# Patient Record
Sex: Male | Born: 1990 | Race: Black or African American | Hispanic: No | Marital: Single | Smoking: Current every day smoker
Health system: Southern US, Community
[De-identification: ages and names within clinical notes are randomized; demographics above are authoritative.]

## PROBLEM LIST (undated history)

## (undated) DIAGNOSIS — G43909 Migraine, unspecified, not intractable, without status migrainosus: Secondary | ICD-10-CM

## (undated) DIAGNOSIS — I82409 Acute embolism and thrombosis of unspecified deep veins of unspecified lower extremity: Secondary | ICD-10-CM

## (undated) DIAGNOSIS — I2699 Other pulmonary embolism without acute cor pulmonale: Secondary | ICD-10-CM

---
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
## 2009-01-25 ENCOUNTER — Emergency Department (HOSPITAL_COMMUNITY): Admission: EM | Admit: 2009-01-25 | Discharge: 2009-01-26 | Payer: Self-pay | Admitting: Emergency Medicine

## 2010-03-04 ENCOUNTER — Ambulatory Visit: Payer: Self-pay | Admitting: Cardiology

## 2010-03-04 ENCOUNTER — Inpatient Hospital Stay (HOSPITAL_COMMUNITY): Admission: EM | Admit: 2010-03-04 | Discharge: 2010-03-06 | Payer: Self-pay | Admitting: Emergency Medicine

## 2010-03-04 ENCOUNTER — Encounter: Payer: Self-pay | Admitting: Cardiology

## 2010-03-05 ENCOUNTER — Encounter: Payer: Self-pay | Admitting: Cardiovascular Disease

## 2010-03-20 DIAGNOSIS — I4891 Unspecified atrial fibrillation: Secondary | ICD-10-CM

## 2010-03-27 ENCOUNTER — Encounter (INDEPENDENT_AMBULATORY_CARE_PROVIDER_SITE_OTHER): Payer: Self-pay | Admitting: *Deleted

## 2011-01-29 NOTE — Letter (Signed)
Summary: Appointment - Missed  [facility], Main Office  [address]   [place], [place] [address]   Phone: [phone]  Fax: [phone]     March 27, 2010 MRN: [id number]   MELO STAUBER [address] [place], [place]  [address]   Dear Mr. SUIT,  Our records indicate you missed your appointment on           03/21/10             with Dr.       .        BRODIE                            It is very important that we reach you to reschedule this appointment. We look forward to participating in your health care needs. Please contact us at the number listed above at your earliest convenience to reschedule this appointment.     Sincerely,    Glass blower/designer

## 2011-03-23 LAB — CBC
HCT: 39.5 % (ref 39.0–52.0)
HCT: 42.7 % (ref 39.0–52.0)
Hemoglobin: 13.4 g/dL (ref 13.0–17.0)
MCHC: 33.8 g/dL (ref 30.0–36.0)
MCV: 89.6 fL (ref 78.0–100.0)
MCV: 89.6 fL (ref 78.0–100.0)
Platelets: 230 10*3/uL (ref 150–400)
RBC: 4.41 MIL/uL (ref 4.22–5.81)
RBC: 4.77 MIL/uL (ref 4.22–5.81)
RDW: 13.7 % (ref 11.5–15.5)
RDW: 14.1 % (ref 11.5–15.5)
WBC: 9.2 10*3/uL (ref 4.0–10.5)

## 2011-03-23 LAB — POCT CARDIAC MARKERS
CKMB, poc: 24.4 ng/mL (ref 1.0–8.0)
Myoglobin, poc: 164 ng/mL (ref 12–200)
Troponin i, poc: 6.38 ng/mL (ref 0.00–0.09)

## 2011-03-23 LAB — POCT I-STAT, CHEM 8
BUN: 9 mg/dL (ref 6–23)
Calcium, Ion: 1.07 mmol/L — ABNORMAL LOW (ref 1.12–1.32)
Chloride: 98 mEq/L (ref 96–112)
Creatinine, Ser: 1.1 mg/dL (ref 0.4–1.5)
HCT: 45 % (ref 39.0–52.0)
Hemoglobin: 15.3 g/dL (ref 13.0–17.0)
Sodium: 135 mEq/L (ref 135–145)
TCO2: 31 mmol/L (ref 0–100)

## 2011-03-23 LAB — BASIC METABOLIC PANEL
BUN: 7 mg/dL (ref 6–23)
GFR calc non Af Amer: >60 mL/min (ref >60)

## 2011-03-23 LAB — DIFFERENTIAL
Basophils Absolute: 0 10*3/uL (ref 0.0–0.1)
Eosinophils Absolute: 0 10*3/uL (ref 0.0–0.7)
Eosinophils Relative: 0 % (ref 0–5)
Monocytes Relative: 17 % — ABNORMAL HIGH (ref 3–12)
Neutro Abs: 5.9 10*3/uL (ref 1.7–7.7)

## 2011-03-23 LAB — CARDIAC PANEL(CRET KIN+CKTOT+MB+TROPI)
Relative Index: 6.5 — ABNORMAL HIGH (ref 0.0–2.5)
Total CK: 2142 U/L — ABNORMAL HIGH (ref 7–232)
Troponin I: 20.58 ng/mL (ref 0.00–0.06)
Troponin I: 20.75 ng/mL (ref 0.00–0.06)

## 2011-03-23 LAB — DRUGS OF ABUSE SCREEN W/O ALC, ROUTINE URINE
Amphetamine Screen, Ur: NEGATIVE
Benzodiazepines.: POSITIVE — AB
Marijuana Metabolite: NEGATIVE

## 2011-03-23 LAB — CK TOTAL AND CKMB (NOT AT ARMC): CK, MB: 51.6 ng/mL (ref 0.3–4.0)

## 2011-04-13 LAB — CBC
MCHC: 33.4 g/dL (ref 30.0–36.0)
MCV: 89.4 fL (ref 78.0–100.0)
RBC: 4.83 MIL/uL (ref 4.22–5.81)

## 2011-04-13 LAB — DIFFERENTIAL
Basophils Relative: 0 % (ref 0–1)
Eosinophils Absolute: 0 10*3/uL (ref 0.0–0.7)
Monocytes Relative: 4 % (ref 3–12)
Neutrophils Relative %: 90 % — ABNORMAL HIGH (ref 43–77)

## 2011-04-13 LAB — BASIC METABOLIC PANEL
BUN: 8 mg/dL (ref 6–23)
CO2: 24 mEq/L (ref 19–32)
Chloride: 103 mEq/L (ref 96–112)
Creatinine, Ser: 1 mg/dL (ref 0.4–1.5)
GFR calc Af Amer: >60 mL/min (ref >60)

## 2011-04-13 LAB — TROPONIN I: Troponin I: <0.01 ng/mL (ref 0.00–0.06)

## 2012-05-31 ENCOUNTER — Other Ambulatory Visit: Payer: Self-pay

## 2012-05-31 ENCOUNTER — Emergency Department (HOSPITAL_COMMUNITY)
Admission: EM | Admit: 2012-05-31 | Discharge: 2012-05-31 | Discharge status: Against Medical Advice | Payer: Self-pay | Attending: Emergency Medicine | Admitting: Emergency Medicine

## 2012-05-31 ENCOUNTER — Encounter (HOSPITAL_COMMUNITY): Payer: Self-pay | Admitting: *Deleted

## 2012-05-31 DIAGNOSIS — R002 Palpitations: Secondary | ICD-10-CM | POA: Insufficient documentation

## 2012-05-31 DIAGNOSIS — R000 Tachycardia, unspecified: Secondary | ICD-10-CM | POA: Insufficient documentation

## 2012-05-31 DIAGNOSIS — F1210 Cannabis abuse, uncomplicated: Secondary | ICD-10-CM | POA: Insufficient documentation

## 2012-05-31 DIAGNOSIS — F411 Generalized anxiety disorder: Secondary | ICD-10-CM | POA: Insufficient documentation

## 2012-05-31 NOTE — ED Notes (Signed)
Pt has come to the nurses station saying "I'm good, is there any way I can check out"  When encouraged to see the EDP, pt continues to say "i'm good, i don't need to see anyone"

## 2012-05-31 NOTE — ED Notes (Signed)
Pt in by ems. Reported to ems that he felt like his heart "was racing" and he was "freaking out." Sts he just smoked some synthetic marijuana. VSS. Denies chest pain, sob, SI/HI.

## 2012-05-31 NOTE — ED Notes (Signed)
Pt reports smoking 2 puffs synthetic marijuana approx 25-64min ago. Anxiety and feeling like his heart was racing started just after that. Sts he has smoked this one time previously with no similar reactions. Pt reports symptoms are resolving currently, palpitations "calming down."

## 2012-11-12 ENCOUNTER — Encounter (HOSPITAL_COMMUNITY): Payer: Self-pay | Admitting: Emergency Medicine

## 2012-11-12 ENCOUNTER — Emergency Department (HOSPITAL_COMMUNITY)
Admission: EM | Admit: 2012-11-12 | Discharge: 2012-11-12 | Disposition: A | Discharge status: Home / Self Care | Payer: Self-pay | Attending: Emergency Medicine | Admitting: Emergency Medicine

## 2012-11-12 ENCOUNTER — Emergency Department (HOSPITAL_COMMUNITY): Payer: Self-pay

## 2012-11-12 DIAGNOSIS — IMO0001 Reserved for inherently not codable concepts without codable children: Secondary | ICD-10-CM | POA: Insufficient documentation

## 2012-11-12 DIAGNOSIS — F17200 Nicotine dependence, unspecified, uncomplicated: Secondary | ICD-10-CM | POA: Insufficient documentation

## 2012-11-12 DIAGNOSIS — J189 Pneumonia, unspecified organism: Secondary | ICD-10-CM

## 2012-11-12 DIAGNOSIS — R509 Fever, unspecified: Secondary | ICD-10-CM | POA: Insufficient documentation

## 2012-11-12 DIAGNOSIS — J159 Unspecified bacterial pneumonia: Secondary | ICD-10-CM | POA: Insufficient documentation

## 2012-11-12 DIAGNOSIS — J029 Acute pharyngitis, unspecified: Secondary | ICD-10-CM | POA: Insufficient documentation

## 2012-11-12 MED ORDER — AZITHROMYCIN 250 MG PO TABS: 250.0000 mg | ORAL_TABLET | Freq: Every day | ORAL | Status: DC

## 2012-11-12 MED ORDER — CEFTRIAXONE SODIUM 1 G IJ SOLR
1.0000 g | Freq: Once | INTRAMUSCULAR | Status: AC
  Administered 2012-11-12: 1 g via INTRAMUSCULAR
  Filled 2012-11-12: qty 10

## 2012-11-12 MED ORDER — ACETAMINOPHEN 325 MG PO TABS
650.0000 mg | ORAL_TABLET | Freq: Once | ORAL | Status: AC
  Administered 2012-11-12: 650 mg via ORAL
  Filled 2012-11-12: qty 2

## 2012-11-12 MED ORDER — AZITHROMYCIN 250 MG PO TABS
500.0000 mg | ORAL_TABLET | Freq: Once | ORAL | Status: AC
  Administered 2012-11-12: 500 mg via ORAL
  Filled 2012-11-12: qty 2

## 2012-11-12 NOTE — ED Provider Notes (Signed)
Medical screening examination/treatment/procedure(s) were performed by non-physician practitioner and as supervising physician I was immediately available for consultation/collaboration.   Rolan Bucco, MD 11/12/12 [phone]

## 2012-11-12 NOTE — ED Notes (Signed)
Pt c/o chills for past 30-44mins.  St's he has had a cold for approx 1 week.

## 2012-11-12 NOTE — ED Notes (Signed)
Pt returned from radiology.

## 2012-11-12 NOTE — ED Provider Notes (Signed)
History     CSN: [id number]  Arrival date & time 11/12/12  1831   First MD Initiated Contact with Patient 11/12/12 1949      Chief Complaint  Patient presents with  . Chills    (Consider location/radiation/quality/duration/timing/severity/associated sxs/prior treatment) HPI Comments: Patient with productive cough for 2 days now with fever and chills. Has not taken any OTC medications PTA  The history is provided by the patient.    History reviewed. No pertinent past medical history.  History reviewed. No pertinent past surgical history.  History reviewed. No pertinent family history.  History  Substance Use Topics  . Smoking status: Current Every Day Smoker  . Smokeless tobacco: Not on file  . Alcohol Use: No      Review of Systems  Constitutional: Positive for fever and chills.  HENT: Positive for sore throat. Negative for congestion, rhinorrhea and trouble swallowing.   Respiratory: Positive for cough. Negative for shortness of breath.   Gastrointestinal: Negative for vomiting, abdominal pain and diarrhea.  Genitourinary: Negative for dysuria.  Musculoskeletal: Positive for myalgias.  Skin: Negative for rash.  Neurological: Negative for weakness and headaches.  Hematological: Negative for adenopathy.  All other systems reviewed and are negative.    Allergies  Review of patient's allergies indicates no known allergies.  Home Medications   Current Outpatient Rx  Name  Route  Sig  Dispense  Refill  . AZITHROMYCIN 250 MG PO TABS   Oral   Take 1 tablet (250 mg total) by mouth daily.   4 tablet   0     BP 144/69  Pulse 89  Temp 100.3 F (37.9 C) (Oral)  Resp 20  SpO2 98%  Physical Exam  Nursing note and vitals reviewed. Constitutional: He is oriented to person, place, and time. He appears well-developed and well-nourished.  HENT:  Head: Normocephalic and atraumatic.  Right Ear: External ear normal.  Left Ear: External ear normal.    Mouth/Throat: Oropharynx is clear and moist. No oropharyngeal exudate.  Eyes: Pupils are equal, round, and reactive to light.  Neck: Normal range of motion.  Cardiovascular: Normal rate and regular rhythm.   Pulmonary/Chest: Effort normal and breath sounds normal. He has no wheezes. He exhibits no tenderness.  Abdominal: Soft. Bowel sounds are normal.  Musculoskeletal: Normal range of motion.  Neurological: He is alert and oriented to person, place, and time.  Skin: Skin is warm and dry. No rash noted. No erythema.    ED Course  Procedures (including critical care time)  Labs Reviewed - No data to display Dg Chest 2 View  11/12/2012  *RADIOLOGY REPORT*  Clinical Data: Cough and fever.  CHEST - 2 VIEW  Comparison: 03/04/2010.  Findings: Right middle lobe consolidation.  Recommend follow-up until clearance.  No pneumothorax.  Heart size within normal limits.  IMPRESSION: Right middle lobe consolidation.   Original Report Authenticated By: Lacy Duverney, M.D.      1. Community acquired pneumonia       MDM  Temperature down given IM Rocephin and PO azithromycin for CAP         Arman Filter, NP 11/12/12 2154

## 2013-03-13 ENCOUNTER — Emergency Department (HOSPITAL_COMMUNITY)
Admission: EM | Admit: 2013-03-13 | Discharge: 2013-03-14 | Disposition: A | Discharge status: Home / Self Care | Payer: No Typology Code available for payment source | Attending: Emergency Medicine | Admitting: Emergency Medicine

## 2013-03-13 DIAGNOSIS — S59912A Unspecified injury of left forearm, initial encounter: Secondary | ICD-10-CM

## 2013-03-13 DIAGNOSIS — Y9389 Activity, other specified: Secondary | ICD-10-CM | POA: Insufficient documentation

## 2013-03-13 DIAGNOSIS — T148XXA Other injury of unspecified body region, initial encounter: Secondary | ICD-10-CM

## 2013-03-13 DIAGNOSIS — IMO0002 Reserved for concepts with insufficient information to code with codable children: Secondary | ICD-10-CM | POA: Insufficient documentation

## 2013-03-13 DIAGNOSIS — F17200 Nicotine dependence, unspecified, uncomplicated: Secondary | ICD-10-CM | POA: Insufficient documentation

## 2013-03-13 DIAGNOSIS — Y92410 Unspecified street and highway as the place of occurrence of the external cause: Secondary | ICD-10-CM | POA: Insufficient documentation

## 2013-03-14 ENCOUNTER — Emergency Department (HOSPITAL_COMMUNITY): Payer: No Typology Code available for payment source

## 2013-03-14 ENCOUNTER — Encounter (HOSPITAL_COMMUNITY): Payer: Self-pay | Admitting: *Deleted

## 2013-03-14 MED ORDER — IBUPROFEN 800 MG PO TABS
800.0000 mg | ORAL_TABLET | Freq: Once | ORAL | Status: AC
  Administered 2013-03-14: 800 mg via ORAL
  Filled 2013-03-14: qty 1

## 2013-03-14 MED ORDER — IBUPROFEN 800 MG PO TABS: 800.0000 mg | ORAL_TABLET | Freq: Three times a day (TID) | ORAL | Status: DC | PRN
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
## 2013-03-14 NOTE — ED Provider Notes (Signed)
Medical screening examination/treatment/procedure(s) were performed by non-physician practitioner and as supervising physician I was immediately available for consultation/collaboration.  Jones Skene, M.D.     Jones Skene, MD 03/14/13 [phone]

## 2014-01-25 ENCOUNTER — Encounter (HOSPITAL_COMMUNITY): Payer: Self-pay | Admitting: Emergency Medicine

## 2014-01-25 ENCOUNTER — Emergency Department (HOSPITAL_COMMUNITY)
Admission: EM | Admit: 2014-01-25 | Discharge: 2014-01-25 | Disposition: A | Discharge status: Home / Self Care | Payer: No Typology Code available for payment source | Attending: Emergency Medicine | Admitting: Emergency Medicine

## 2014-01-25 DIAGNOSIS — Y929 Unspecified place or not applicable: Secondary | ICD-10-CM | POA: Insufficient documentation

## 2014-01-25 DIAGNOSIS — Y9389 Activity, other specified: Secondary | ICD-10-CM | POA: Insufficient documentation

## 2014-01-25 DIAGNOSIS — F17200 Nicotine dependence, unspecified, uncomplicated: Secondary | ICD-10-CM | POA: Insufficient documentation

## 2014-01-25 DIAGNOSIS — X58XXXA Exposure to other specified factors, initial encounter: Secondary | ICD-10-CM | POA: Insufficient documentation

## 2014-01-25 DIAGNOSIS — Z7982 Long term (current) use of aspirin: Secondary | ICD-10-CM | POA: Insufficient documentation

## 2014-01-25 DIAGNOSIS — S0300XA Dislocation of jaw, unspecified side, initial encounter: Secondary | ICD-10-CM | POA: Insufficient documentation

## 2014-01-25 MED ORDER — MIDAZOLAM HCL 2 MG/2ML IJ SOLN
INTRAMUSCULAR | Status: AC
  Administered 2014-01-25: 2 mg
  Filled 2014-01-25: qty 4

## 2014-01-25 MED ORDER — MIDAZOLAM HCL 2 MG/2ML IJ SOLN
2.0000 mg | Freq: Once | INTRAMUSCULAR | Status: AC
  Administered 2014-01-25: 2 mg via INTRAVENOUS
  Filled 2014-01-25: qty 2

## 2014-01-25 MED ORDER — FLUMAZENIL 0.5 MG/5ML IV SOLN
0.2000 mg | Freq: Once | INTRAVENOUS | Status: DC
  Filled 2014-01-25: qty 5

## 2014-01-25 NOTE — ED Provider Notes (Addendum)
CSN: [id number]     Arrival date & time 01/25/14  1133 History   First MD Initiated Contact with Patient 01/25/14 1149     Chief Complaint  Patient presents with  . Jaw Pain   (Consider location/radiation/quality/duration/timing/severity/associated sxs/prior Treatment) HPI 23 yo male presents with bilateral jaw pain and jaw dislocation that occurred earlier today while patient was yawning. Patient denies any trauma or injury. Patient states that his pain is dull, constant, and rated at 5/10. Pain worse with attempt to close mouth which is rated at 10/10. Patient states this is the first time this has occurred. Denies any prior medical hx. Denies any medication allergies. Denies any current medications other than aspirin and ibuprofen that he takes for tooth aches. Denies drug use.  History reviewed. No pertinent past medical history. History reviewed. No pertinent past surgical history. History reviewed. No pertinent family history. History  Substance Use Topics  . Smoking status: Current Every Day Smoker -- 0.50 packs/day    Types: Cigarettes  . Smokeless tobacco: Not on file  . Alcohol Use: No    Review of Systems  All other systems reviewed and are negative.    Allergies  Review of patient's allergies indicates no known allergies.  Home Medications   Current Outpatient Rx  Name  Route  Sig  Dispense  Refill  . aspirin 325 MG EC tablet   Oral   Take 325-650 mg by mouth every 4 (four) hours as needed for pain.         Marland Kitchen ibuprofen (ADVIL,MOTRIN) 200 MG tablet   Oral   Take 400 mg by mouth every 6 (six) hours as needed for moderate pain.          BP 113/64  Pulse 60  Temp(Src) 97.5 F (36.4 C) (Axillary)  Resp 14  SpO2 98% Physical Exam  Nursing note and vitals reviewed. Constitutional: He is oriented to person, place, and time. He appears well-developed and well-nourished. No distress.  HENT:  Head: Normocephalic and atraumatic.  Nose: Nose normal.   Mouth/Throat: Uvula is midline, oropharynx is clear and moist and mucous membranes are normal.  Mouth noted to be suck in open position.   Eyes: Conjunctivae and EOM are normal.  Cardiovascular: Normal rate and regular rhythm.  Exam reveals no gallop and no friction rub.   No murmur heard. Pulmonary/Chest: Effort normal and breath sounds normal. No respiratory distress. He has no wheezes. He has no rales.  Musculoskeletal: Normal range of motion. He exhibits no edema.  Neurological: He is alert and oriented to person, place, and time.  Skin: Skin is warm and dry. He is not diaphoretic.  Psychiatric: He has a normal mood and affect. His behavior is normal.    ED Course  Reduction of Dislocated Jaw Date/Time: 01/25/2014 1:27 PM Performed by: Rudene Anda Authorized by: Rudene Anda Consent: Verbal consent obtained. Risks and benefits: risks, benefits and alternatives were discussed Consent given by: patient Patient understanding: patient states understanding of the procedure being performed Site marked: the operative site was marked Patient identity confirmed: arm band and verbally with patient Time out: Immediately prior to procedure a "time out" was called to verify the correct patient, procedure, equipment, support staff and site/side marked as required. Preparation: Patient was prepped and draped in the usual sterile fashion. Local anesthesia used: no Patient sedated: yes Sedation type: moderate (conscious) sedation Sedatives: midazolam Sedation start date/time: 01/25/2014 12:45 PM Sedation end date/time: 01/25/2014 1:08 PM Vitals: Vital signs  were monitored during sedation. Patient tolerance: Patient tolerated the procedure well with no immediate complications. Comments: Good sedation of patient achieved. Downward traction was applied equally to bilateral sides of the mandible along patient's posterior inferior molars. While maintaining downward traction, gentle  forward force was applied until mandible was felt to slide back in to its normal anatomical position. Patient achieved immediate relief of pain and normal jaw alignment was achieved. Patient able to move jaw freely without pain.  Will monitor patient for 1 hour prior to discharge.     (including critical care time) Labs Review Labs Reviewed - No data to display Imaging Review No results found.  EKG Interpretation   None       MDM   1. Dislocated jaw    Patient tolerated procedure well. Jaw alignment normal. Patient able to talk without pain. Denies pain in jaw. Patient ambulated in hall without assistance. Patient VS are normal. Appears stable for discharge. Plan to have patient follow up with oral surgery in 2 days for reevaluation. Recommend soft foods and avoid yawning or chewing gum.   Meds given in ED:  Medications  flumazenil (ROMAZICON) injection 0.2 mg (0.2 mg Intravenous Not Given 01/25/14 1330)  midazolam (VERSED) injection 2 mg (2 mg Intravenous Given 01/25/14 1303)  midazolam (VERSED) 2 MG/2ML injection (2 mg  Given 01/25/14 1307)    New Prescriptions   No medications on file       Rudene AndaJacob Gray Dresden Lozito, PA-C 01/26/14 1800  Rudene AndaJacob Gray Bracy Pepper, PA-C 02/05/14 1214

## 2014-01-25 NOTE — ED Notes (Signed)
Mandible relocated by PA-C with Dr. Estell HarpinZammit at bedside.  Pt tolerated well.

## 2014-01-25 NOTE — ED Notes (Signed)
Ambulated patient without any incident.

## 2014-01-25 NOTE — Discharge Instructions (Signed)
Call and make appointment with Oral Surgery in a 2 days. Avoid opening mouth wide, and avoid tough/hard foods. Recommend soft food diet to prevent recurrence of dislocation. Return to ED should dislocation reoccur or you develop significant pain in jaw prior to followup with Oral surgery.    Emergency Department Resource Guide 1) Find a Doctor and Pay Out of Pocket Although you won't have to find out who is covered by your insurance plan, it is a good idea to ask around and get recommendations. You will then need to call the office and see if the doctor you have chosen will accept you as a new patient and what types of options they offer for patients who are self-pay. Some doctors offer discounts or will set up payment plans for their patients who do not have insurance, but you will need to ask so you aren't surprised when you get to your appointment.  2) Contact Your Local Health Department Not all health departments have doctors that can see patients for sick visits, but many do, so it is worth a call to see if yours does. If you don't know where your local health department is, you can check in your phone book. The CDC also has a tool to help you locate your state's health department, and many state websites also have listings of all of their local health departments.  3) Find a Walk-in Clinic If your illness is not likely to be very severe or complicated, you may want to try a walk in clinic. These are popping up all over the country in pharmacies, drugstores, and shopping centers. They're usually staffed by nurse practitioners or physician assistants that have been trained to treat common illnesses and complaints. They're usually fairly quick and inexpensive. However, if you have serious medical issues or chronic medical problems, these are probably not your best option.  No Primary Care Doctor: - Call Health Connect at  [phone] - they can help you locate a primary care doctor that  accepts your  insurance, provides certain services, etc. - Physician Referral Service- [phone]  Chronic Pain Problems: Organization         Address  Phone   Notes  [facility]  [phone] Patients need to be referred by their primary care doctor.   Medication Assistance: Organization         Address  Phone   Notes  [facility] [address] [place], [place] [id number] [phone] --Must be a resident of [facility] -- Must have NO insurance coverage whatsoever (no Medicaid/ Medicare, etc.) -- The pt. MUST have a primary care doctor that directs their care regularly and follows them in the community   [facility]  [phone]   [employer]  [phone]    Agencies that provide inexpensive medical care: Organization         Address  Phone   Notes  [facility]  [phone]   [facility]    [phone]   [facility] [address] [place], [place] [address] [phone]   [facility] [address], [place] [phone]   [facility]    [phone]   [facility]    [phone]   [facility]  [address], [place] Phone:  [phone], Fax:  [phone] Hours of Operation:  9 am -  6 pm, M-F.  Also accepts Medicaid/Medicare and self-pay.  °[facility] ° [address], [place] Phone: [phone], Fax: [phone]. Hours of Operation:  8:30 am - 5:30 pm, M-F.  Also accepts Medicaid and self-pay.  °[facility] [place] [address], [place] Phone: ([phone]) [phone]   °[facility] [address], [place], [place] ([phone], Ext. 123 Mondays & Thursdays: 7-9 AM.  First 15 patients are seen on a first come, first serve basis. °  ° °Medicaid-accepting [place] Providers: ° °Organization          Address  Phone   Notes  °[facility] [address], [place] [phone]) [phone] Also accepts self-pay patients.  °[facility] [address], [place] ° ([phone]) [phone]   °[facility] [address], [place] (336) [phone]   °[facility] [address] [place] [address], [place] ([phone]) [phone]   °Veita Bland [address], [place]  ° ([phone]) [phone] Only accepts [place] Access Medicaid patients after they have their name applied to their card.  ° °Self-Pay (no insurance) in [place]: ° °Organization         Address  Phone   Notes  °Sickle Cell Patients, [facility] [address], [place] ([phone]) [phone]   °[facility] [address], [place] (336) [phone]   °[facility] ° [address], [place] ([phone]) [phone]   °[facility]/Dr. Osei-Bonsu ° [address] [place] [address], [place] or [address], [place] [phone]) [phone] Phone number for both [place] and [place] locations is the same.  °[facility] [address], [place] ([phone]) [phone]   °[facility] [address] [place] [address], [place] or [address] (336) [phone] °(336) [phone]   °[facility] [address], [place] (336) [phone], phone; ([phone], fax Sees patients 1st and 3rd Saturday of every month.  Must not qualify for public or private insurance (i.e. Medicaid, Medicare, [place] Health Choice, Veterans' Benefits) • Household income should be no more than 200% of the poverty level •The clinic cannot treat you if you are pregnant or think you are pregnant • Sexually transmitted diseases are not treated at the clinic.  ° ° °Dental Care: °Organization         Address  Phone  Notes  °[facility] [address],  [place] ([phone]) [phone] Accepts children up to age 21 who are enrolled in Medicaid or [place] Health Choice; pregnant women with a Medicaid card; and children who have applied for Medicaid or [place] Health Choice, but were declined, whose parents can pay a reduced fee at time of service.  °[facility] [place]  [address], [place] (336) 641-7733 Accepts children up to age 21 who are enrolled in Medicaid or [place] Health Choice; pregnant women with a Medicaid card; and children who have applied for Medicaid or [place] Health Choice, but were declined, whose parents can pay a reduced fee at time of service.  °[facility] ° [address],  (336) [phone] Patients are seen by appointment only. Walk-ins are not accepted. [facility] will see patients 18 years of age and   older. °Monday - Tuesday (8am-5pm) °Most Wednesdays (8:30-5pm) °$30 per visit, cash only  °[facility] ° [address], [place] (336) [phone] Patients are seen by appointment only. Walk-ins are not accepted. [facility] will see patients 18 years of age and older. °One Wednesday Evening (Monthly: Volunteer Based).  $30 per visit, cash only  °[facility]  ([phone]) [phone] for adults; Children under age 4, call Graduate Pediatric Dentistry at [phone]. Children aged 4-14, please call [phone] to request a pediatric application. ° Dental services are provided in all areas of dental care including fillings, crowns and bridges, complete and partial dentures, implants, gum treatment, root canals, and extractions. Preventive care is also provided. Treatment is provided to both adults and children. °Patients are selected via a lottery and there is often a waiting list. °  °[facility] [address], °[place] ° (336) 763-8833 [email or web address] °  °[facility] [address], [place], [place]  (336)723-1848, Ext. 123 Second and Fourth Thursday of each month, opens at 6:30 AM; Clinic ends at 9 AM.  Patients are seen on a first-come first-served basis, and a limited number are seen during each clinic.  ° °[facility] ° [address], [place], [place] (336) [phone]   Eligibility Requirements °You must have lived in [place], or [place] counties for at least the last three months. °  You cannot be eligible for state or federal sponsored healthcare insurance, including Veterans Administration, Medicaid, or Medicare. °  You generally cannot be eligible for healthcare insurance through your employer.  °  How to apply: °Eligibility screenings are held every Tuesday and Wednesday afternoon from 1:00 pm until 4:00 pm. You do not need an appointment for the interview!  °[facility] [address], [place], [place] [phone]   °[facility]  336-342-8273   °[facility]  336-703-3100   °[facility]  336-570-6415   ° °Behavioral Health Resources in the Community: °Intensive Outpatient Programs °Organization         Address  Phone  Notes  °[facility] [address], [place], [place] 336-878-6098   °[facility] [address], [place], [place] 336-832-9800   °[facility] [address], [place], [place] ° 336-882-2125   °[facility] [address],  °[place], [place] 1-800-853-5163 or 336-641-4981   °Substance Abuse Resources °Organization         Address  Phone  Notes  °[facility]  336-882-2125   °[facility]  336-784-9470   °[facility]  336-285-9073   °[facility]  336-845-3988   °Residential & Outpatient Substance Abuse Program  1-800-659-3381   °Psychological Services °Organization         Address  Phone  Notes  °[facility]  336- 832-9600   °[facility]  336- 378-7881    °[facility] [address], [place] 1-800-853-5163 or 336-641-4981   ° °Mobile Crisis Teams °Organization         Address  Phone  Notes  °[facility], Mobile Crisis Care Unit  1-877-626-1772   °Assertive °[facility] ° [address] [place], [place] 336-834-9664   °Sharon DeEsch [address] °[place] 336-554-5454   ° °Self-Help/Support Groups °Organization         Address    Phone             Notes  °[facility] [place] - variety of support groups  336- 373-1402 Call for more information  °Narcotics Anonymous (NA), [facility] [address], °[place]  2 meetings at this location  ° °Residential Treatment Programs °Organization         Address  Phone  Notes  °[facility] [address],    °[place]  1-866-801-8205   °[facility] ° [address], [place], [place] 704-293-8524   °[facility] [address], [place] 336-845-3988 Admissions: 8am-3pm M-F  °[facility] [address]    °[place], [place] 336-841-1104   °[facility] [address], [place], [place] 336-379-7146   °[facility] [address]  °[place], [place] 336-285-9073   °[facility] - Intensive Outpatient [address], [place], [place] 336-852-3033   °[facility] [address]  °[place], [place] 1-877-615-2722 or 336-784-9470   °[facility] ([facility] [address] [place], [place] 336-227-7417 Accepts Medicaid  °[facility] [address]  °[place] 1-800-659-3381 Substance Abuse/Addiction Treatment  ° °[place] Behavioral Health Resources °Organization         Address  Phone  Notes  °[facility]  (888) 581-9988   °Julie Brannon, PhD [address] [place], [place]   (336) 349-5553 or (336) 951-0000   °[facility]   [address] °[place], [place] (336) 349-4454   °[facility] [address], [place], [place] (336) 342-8316 Insurance/Medicaid/sponsorship through [facility]  °[facility] [address]                                    [place], [place] (336) 342-8316 Therapy/tele-psych/case  °[facility] [address]  ° [place], [place] (336) 349-2233    °Dr. Arfeen  (336) 349-4544   °[facility] [place]  [employer] [place] [facility] 1) [address],  °2) [address], [place] °3)  [address], [place] (336) 349-3220 °(336) 342-7768 ° °(336) 342-8140   °[place] [facility] (336) 342-1394 or (336) 342-3537 (After Hours)    ° ° ° °

## 2014-01-25 NOTE — ED Notes (Signed)
Pt rests with eyes closed but is easily awakened.  Pt is oriented x 4, able to give personal information to registration.

## 2014-01-25 NOTE — ED Notes (Signed)
Pt reports yawning approx 30 mins ago and now unable to close his mouth and having jaw pain. No hx of same.

## 2014-01-26 NOTE — ED Provider Notes (Signed)
Medical screening examination/treatment/procedure(s) were conducted as a shared visit with non-physician practitioner(s) and myself.  I personally evaluated the patient during the encounter.  EKG Interpretation   None       Pt with jaw locked open.  pe mouth locked open.  Pt cannot close mouth  Benny LennertJoseph L Patrisia Faeth, MD 01/26/14 2255

## 2014-02-05 NOTE — ED Provider Notes (Signed)
Medical screening examination/treatment/procedure(s) were performed by non-physician practitioner and as supervising physician I was immediately available for consultation/collaboration.  EKG Interpretation   None         Careli Luzader L Nguyen Todorov, MD 02/05/14 1534 

## 2014-04-05 ENCOUNTER — Encounter (HOSPITAL_COMMUNITY): Payer: Self-pay | Admitting: Emergency Medicine

## 2014-04-05 ENCOUNTER — Emergency Department (HOSPITAL_COMMUNITY)
Admission: EM | Admit: 2014-04-05 | Discharge: 2014-04-05 | Disposition: A | Discharge status: Home / Self Care | Payer: No Typology Code available for payment source | Attending: Emergency Medicine | Admitting: Emergency Medicine

## 2014-04-05 DIAGNOSIS — Y939 Activity, unspecified: Secondary | ICD-10-CM | POA: Insufficient documentation

## 2014-04-05 DIAGNOSIS — F17200 Nicotine dependence, unspecified, uncomplicated: Secondary | ICD-10-CM | POA: Insufficient documentation

## 2014-04-05 DIAGNOSIS — M542 Cervicalgia: Secondary | ICD-10-CM | POA: Insufficient documentation

## 2014-04-05 DIAGNOSIS — M25519 Pain in unspecified shoulder: Secondary | ICD-10-CM | POA: Insufficient documentation

## 2014-04-05 DIAGNOSIS — Y9289 Other specified places as the place of occurrence of the external cause: Secondary | ICD-10-CM | POA: Insufficient documentation

## 2014-04-05 MED ORDER — HYDROCODONE-ACETAMINOPHEN 5-325 MG PO TABS: 1.0000 | ORAL_TABLET | Freq: Four times a day (QID) | ORAL | Status: DC | PRN
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
## 2014-04-05 NOTE — ED Provider Notes (Signed)
Medical screening examination/treatment/procedure(s) were performed by non-physician practitioner and as supervising physician I was immediately available for consultation/collaboration.   EKG Interpretation None        Curlee Bogan, MD 04/05/14 2354 

## 2014-06-10 ENCOUNTER — Emergency Department (HOSPITAL_COMMUNITY)
Admission: EM | Admit: 2014-06-10 | Discharge: 2014-06-10 | Disposition: A | Discharge status: Home / Self Care | Payer: No Typology Code available for payment source | Attending: Emergency Medicine | Admitting: Emergency Medicine

## 2014-06-10 ENCOUNTER — Encounter (HOSPITAL_COMMUNITY): Payer: Self-pay | Admitting: Emergency Medicine

## 2014-06-10 DIAGNOSIS — S161XXA Strain of muscle, fascia and tendon at neck level, initial encounter: Secondary | ICD-10-CM

## 2014-06-10 DIAGNOSIS — F17200 Nicotine dependence, unspecified, uncomplicated: Secondary | ICD-10-CM | POA: Insufficient documentation

## 2014-06-10 DIAGNOSIS — Y92410 Unspecified street and highway as the place of occurrence of the external cause: Secondary | ICD-10-CM | POA: Insufficient documentation

## 2014-06-10 DIAGNOSIS — Y9389 Activity, other specified: Secondary | ICD-10-CM | POA: Insufficient documentation

## 2014-06-10 DIAGNOSIS — H6690 Otitis media, unspecified, unspecified ear: Secondary | ICD-10-CM | POA: Insufficient documentation

## 2014-06-10 DIAGNOSIS — S0990XA Unspecified injury of head, initial encounter: Secondary | ICD-10-CM | POA: Insufficient documentation

## 2014-06-10 DIAGNOSIS — H6691 Otitis media, unspecified, right ear: Secondary | ICD-10-CM

## 2014-06-10 DIAGNOSIS — J029 Acute pharyngitis, unspecified: Secondary | ICD-10-CM | POA: Insufficient documentation

## 2014-06-10 DIAGNOSIS — S139XXA Sprain of joints and ligaments of unspecified parts of neck, initial encounter: Secondary | ICD-10-CM | POA: Insufficient documentation

## 2014-06-10 MED ORDER — CYCLOBENZAPRINE HCL 10 MG PO TABS: 10.0000 mg | ORAL_TABLET | Freq: Two times a day (BID) | ORAL | Status: DC | PRN

## 2014-06-10 MED ORDER — AMOXICILLIN 500 MG PO CAPS: 500.0000 mg | ORAL_CAPSULE | Freq: Three times a day (TID) | ORAL | Status: DC

## 2014-06-10 MED ORDER — NAPROXEN 500 MG PO TABS: 500.0000 mg | ORAL_TABLET | Freq: Two times a day (BID) | ORAL | Status: DC

## 2014-06-10 NOTE — Discharge Instructions (Signed)
Cervical Sprain °A cervical sprain is an injury in the neck in which the strong, fibrous tissues (ligaments) that connect your neck bones stretch or tear. Cervical sprains can range from mild to severe. Severe cervical sprains can cause the neck vertebrae to be unstable. This can lead to damage of the spinal cord and can result in serious nervous system problems. The amount of time it takes for a cervical sprain to get better depends on the cause and extent of the injury. Most cervical sprains heal in 1 to 3 weeks. °CAUSES  °Severe cervical sprains may be caused by:  °· Contact sport injuries (such as from football, rugby, wrestling, hockey, auto racing, gymnastics, diving, martial arts, or boxing).   °· Motor vehicle collisions.   °· Whiplash injuries. This is an injury from a sudden forward-and backward whipping movement of the head and neck.  °· Falls.   °Mild cervical sprains may be caused by:  °· Being in an awkward position, such as while cradling a telephone between your ear and shoulder.   °· Sitting in a chair that does not offer proper support.   °· Working at a poorly designed computer station.   °· Looking up or down for long periods of time.   °SYMPTOMS  °· Pain, soreness, stiffness, or a burning sensation in the front, back, or sides of the neck. This discomfort may develop immediately after the injury or slowly, 24 hours or more after the injury.   °· Pain or tenderness directly in the middle of the back of the neck.   °· Shoulder or upper back pain.   °· Limited ability to move the neck.   °· Headache.   °· Dizziness.   °· Weakness, numbness, or tingling in the hands or arms.   °· Muscle spasms.   °· Difficulty swallowing or chewing.   °· Tenderness and swelling of the neck.   °DIAGNOSIS  °Most of the time your health care provider can diagnose a cervical sprain by taking your history and doing a physical exam. Your health care provider will ask about previous neck injuries and any known neck  problems, such as arthritis in the neck. X-rays may be taken to find out if there are any other problems, such as with the bones of the neck. Other tests, such as a CT scan or MRI, may also be needed.  °TREATMENT  °Treatment depends on the severity of the cervical sprain. Mild sprains can be treated with rest, keeping the neck in place (immobilization), and pain medicines. Severe cervical sprains are immediately immobilized. Further treatment is done to help with pain, muscle spasms, and other symptoms and may include: °· Medicines, such as pain relievers, numbing medicines, or muscle relaxants.   °· Physical therapy. This may involve stretching exercises, strengthening exercises, and posture training. Exercises and improved posture can help stabilize the neck, strengthen muscles, and help stop symptoms from returning.   °HOME CARE INSTRUCTIONS  °· Put ice on the injured area.   °· Put ice in a plastic bag.   °· Place a towel between your skin and the bag.   °· Leave the ice on for 15 20 minutes, 3 4 times a day.   °· If your injury was severe, you may have been given a cervical collar to wear. A cervical collar is a two-piece collar designed to keep your neck from moving while it heals. °· Do not remove the collar unless instructed by your health care provider. °· If you have long hair, keep it outside of the collar. °· Ask your health care provider before making any adjustments to your collar.   Minor adjustments may be required over time to improve comfort and reduce pressure on your chin or on the back of your head.  Ifyou are allowed to remove the collar for cleaning or bathing, follow your health care provider's instructions on how to do so safely.  Keep your collar clean by wiping it with mild soap and water and drying it completely. If the collar you have been given includes removable pads, remove them every 1 2 days and hand wash them with soap and water. Allow them to air dry. They should be completely  dry before you wear them in the collar.  If you are allowed to remove the collar for cleaning and bathing, wash and dry the skin of your neck. Check your skin for irritation or sores. If you see any, tell your health care provider.  Do not drive while wearing the collar.   Only take over-the-counter or prescription medicines for pain, discomfort, or fever as directed by your health care provider.   Keep all follow-up appointments as directed by your health care provider.   Keep all physical therapy appointments as directed by your health care provider.   Make any needed adjustments to your workstation to promote good posture.   Avoid positions and activities that make your symptoms worse.   Warm up and stretch before being active to help prevent problems.  SEEK MEDICAL CARE IF:   Your pain is not controlled with medicine.   You are unable to decrease your pain medicine over time as planned.   Your activity level is not improving as expected.  SEEK IMMEDIATE MEDICAL CARE IF:   You develop any bleeding.  You develop stomach upset.  You have signs of an allergic reaction to your medicine.   Your symptoms get worse.   You develop new, unexplained symptoms.   You have numbness, tingling, weakness, or paralysis in any part of your body.  MAKE SURE YOU:   Understand these instructions.  Will watch your condition.  Will get help right away if you are not doing well or get worse. Document Released: 10/11/2007 Document Revised: 10/04/2013 Document Reviewed: 06/21/2013 [facility] Patient Information 2014 [employer], [place]. Otitis Media, Adult Otitis media is redness, soreness, and swelling (inflammation) of the middle ear. Otitis media may be caused by allergies or, most commonly, by infection. Often it occurs as a complication of the common cold. SIGNS AND SYMPTOMS Symptoms of otitis media may include:  Earache.  Fever.  Ringing in your  ear.  Headache.  Leakage of fluid from the ear. DIAGNOSIS To diagnose otitis media, your health care provider will examine your ear with an otoscope. This is an instrument that allows your health care provider to see into your ear in order to examine your eardrum. Your health care provider also will ask you questions about your symptoms. TREATMENT  Typically, otitis media resolves on its own within 3 5 days. Your health care provider may prescribe medicine to ease your symptoms of pain. If otitis media does not resolve within 5 days or is recurrent, your health care provider may prescribe antibiotic medicines if he or she suspects that a bacterial infection is the cause. HOME CARE INSTRUCTIONS   Take your medicine as directed until it is gone, even if you feel better after the first few days.  Only take over-the-counter or prescription medicines for pain, discomfort, or fever as directed by your health care provider.  Follow up with your health care provider as directed. SEEK MEDICAL  CARE IF:  You have otitis media only in one ear or bleeding from your nose or both.  You notice a lump on your neck.  You are not getting better in 3 5 days.  You feel worse instead of better. SEEK IMMEDIATE MEDICAL CARE IF:   You have pain that is not controlled with medicine.  You have swelling, redness, or pain around your ear or stiffness in your neck.  You notice that part of your face is paralyzed.  You notice that the bone behind your ear (mastoid) is tender when you touch it. MAKE SURE YOU:   Understand these instructions.  Will watch your condition.  Will get help right away if you are not doing well or get worse. Document Released: 09/18/2004 Document Revised: 10/04/2013 Document Reviewed: 07/11/2013 [facility] Patient Information 2014 [employer], [place].
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
## 2014-06-11 NOTE — ED Provider Notes (Signed)
Medical screening examination/treatment/procedure(s) were performed by non-physician practitioner and as supervising physician I was immediately available for consultation/collaboration.   EKG Interpretation None        Amaliya Whitelaw F Jawana Reagor, MD 06/11/14 1447 

## 2014-07-11 ENCOUNTER — Emergency Department (HOSPITAL_COMMUNITY)
Admission: EM | Admit: 2014-07-11 | Discharge: 2014-07-11 | Disposition: A | Discharge status: Home / Self Care | Payer: No Typology Code available for payment source | Attending: Emergency Medicine | Admitting: Emergency Medicine

## 2014-07-11 ENCOUNTER — Encounter (HOSPITAL_COMMUNITY): Payer: Self-pay | Admitting: Emergency Medicine

## 2014-07-11 DIAGNOSIS — K0889 Other specified disorders of teeth and supporting structures: Secondary | ICD-10-CM

## 2014-07-11 DIAGNOSIS — K089 Disorder of teeth and supporting structures, unspecified: Secondary | ICD-10-CM | POA: Insufficient documentation

## 2014-07-11 DIAGNOSIS — F17200 Nicotine dependence, unspecified, uncomplicated: Secondary | ICD-10-CM | POA: Insufficient documentation

## 2014-07-11 MED ORDER — NAPROXEN 500 MG PO TABS
500.0000 mg | ORAL_TABLET | Freq: Once | ORAL | Status: AC
  Administered 2014-07-11: 500 mg via ORAL
  Filled 2014-07-11: qty 1

## 2014-07-11 MED ORDER — PENICILLIN V POTASSIUM 500 MG PO TABS: 500.0000 mg | ORAL_TABLET | Freq: Four times a day (QID) | ORAL | Status: DC

## 2014-07-11 MED ORDER — MELOXICAM 15 MG PO TABS: 15.0000 mg | ORAL_TABLET | Freq: Every day | ORAL | Status: DC

## 2014-07-11 NOTE — ED Notes (Signed)
Pt states that he has had dental pain / swollen gums x 1 weeks; pt also c/o headache that radiates down his neck to his shoulders; pt states that it just aches and throbs and he is unable to sleep

## 2014-07-11 NOTE — ED Provider Notes (Signed)
CSN: [id number]     Arrival date & time 07/11/14  4098 History   First MD Initiated Contact with Patient 07/11/14 [phone]     Chief Complaint  Patient presents with  . Dental Pain  . Headache     (Consider location/radiation/quality/duration/timing/severity/associated sxs/prior Treatment) HPI Comments: The patient is a 23 year old otherwise healthy male who presents with dental pain that started gradually one week ago. The dental pain is severe, constant and progressively worsening. The pain is aching and located in left upper jaw. The pain radiates down to his left neck and shoulder. Eating makes the pain worse. Nothing makes the pain better. The patient has not tried anything for pain. No associated symptoms. Patient denies headache, neck pain/stiffness, fever, NVD, edema, sore throat, throat swelling, wheezing, SOB, chest pain, abdominal pain.     Patient is a 23 y.o. male presenting with tooth pain and headaches.  Dental Pain Associated symptoms: headaches   Associated symptoms: no fever and no neck pain   Headache Associated symptoms: no abdominal pain, no diarrhea, no dizziness, no fatigue, no fever, no nausea, no neck pain and no vomiting     History reviewed. No pertinent past medical history. History reviewed. No pertinent past surgical history. No family history on file. History  Substance Use Topics  . Smoking status: Current Every Day Smoker -- 0.50 packs/day    Types: Cigarettes  . Smokeless tobacco: Not on file  . Alcohol Use: No    Review of Systems  Constitutional: Negative for fever, chills and fatigue.  HENT: Positive for dental problem. Negative for trouble swallowing.   Eyes: Negative for visual disturbance.  Respiratory: Negative for shortness of breath.   Cardiovascular: Negative for chest pain and palpitations.  Gastrointestinal: Negative for nausea, vomiting, abdominal pain and diarrhea.  Genitourinary: Negative for dysuria and difficulty urinating.   Musculoskeletal: Negative for arthralgias and neck pain.  Skin: Negative for color change.  Neurological: Positive for headaches. Negative for dizziness and weakness.  Psychiatric/Behavioral: Negative for dysphoric mood.      Allergies  Review of patient's allergies indicates no known allergies.  Home Medications   Prior to Admission medications   Medication Sig Start Date End Date Taking? Authorizing Provider  ibuprofen (ADVIL,MOTRIN) 200 MG tablet Take 400 mg by mouth every 6 (six) hours as needed (for pain/swelling).   Yes Historical Provider, MD   BP 139/84  Pulse 69  Temp(Src) 98.1 F (36.7 C) (Oral)  Resp 18  Ht 5\' 9"  (1.753 m)  Wt 195 lb (88.451 kg)  BMI 28.78 kg/m2  SpO2 99% Physical Exam  Nursing note and vitals reviewed. Constitutional: He is oriented to person, place, and time. He appears well-developed and well-nourished. No distress.  HENT:  Head: Normocephalic and atraumatic.  Mouth/Throat: Oropharynx is clear and moist. No oropharyngeal exudate.  No abscess noted. No teeth tenderness to percussion. Good dentition.   Eyes: Conjunctivae and EOM are normal.  Neck: Normal range of motion. Neck supple.  Cardiovascular: Normal rate and regular rhythm.  Exam reveals no gallop and no friction rub.   No murmur heard. Pulmonary/Chest: Effort normal and breath sounds normal. He has no wheezes. He has no rales. He exhibits no tenderness.  Musculoskeletal: Normal range of motion.  No neck or shoulder tenderness to palpation bilaterally.   Lymphadenopathy:    He has no cervical adenopathy.  Neurological: He is alert and oriented to person, place, and time. Coordination normal.  Speech is goal-oriented. Moves limbs without ataxia.  Skin: Skin is warm and dry.  Psychiatric: He has a normal mood and affect. His behavior is normal.    ED Course  Procedures (including critical care time) Labs Review Labs Reviewed - No data to display  Imaging Review No results  found.   EKG Interpretation None      MDM   Final diagnoses:  Pain, dental    7:26 AM Patient will be treated with Veetid and Mobic for dental pain. Vitals stable and patient afebrile. Patient will be referred to Dr. Russella DarBenitez for dental follow up.     Emilia BeckKaitlyn Zohaib Heeney, PA-C 07/11/14 [phone]

## 2014-07-11 NOTE — Discharge Instructions (Signed)
Take Veetid as directed until gone. Take Mobic as needed for pain. Refer to attached documents for more information. Follow up with Dr. Russella DarBenitez for further evaluation and management of your dental pain.

## 2014-07-12 NOTE — ED Provider Notes (Signed)
Medical screening examination/treatment/procedure(s) were performed by non-physician practitioner and as supervising physician I was immediately available for consultation/collaboration.   Melvina Pangelinan T Watson Robarge, MD 07/12/14 2245 

## 2015-03-28 ENCOUNTER — Emergency Department (HOSPITAL_COMMUNITY)
Admission: EM | Admit: 2015-03-28 | Discharge: 2015-03-28 | Disposition: A | Discharge status: Home / Self Care | Payer: Self-pay | Attending: Emergency Medicine | Admitting: Emergency Medicine

## 2015-03-28 ENCOUNTER — Encounter (HOSPITAL_COMMUNITY): Payer: Self-pay | Admitting: *Deleted

## 2015-03-28 DIAGNOSIS — Z791 Long term (current) use of non-steroidal anti-inflammatories (NSAID): Secondary | ICD-10-CM | POA: Insufficient documentation

## 2015-03-28 DIAGNOSIS — Y9389 Activity, other specified: Secondary | ICD-10-CM | POA: Insufficient documentation

## 2015-03-28 DIAGNOSIS — S80862A Insect bite (nonvenomous), left lower leg, initial encounter: Secondary | ICD-10-CM | POA: Insufficient documentation

## 2015-03-28 DIAGNOSIS — Y9289 Other specified places as the place of occurrence of the external cause: Secondary | ICD-10-CM | POA: Insufficient documentation

## 2015-03-28 DIAGNOSIS — W57XXXA Bitten or stung by nonvenomous insect and other nonvenomous arthropods, initial encounter: Secondary | ICD-10-CM | POA: Insufficient documentation

## 2015-03-28 DIAGNOSIS — Z792 Long term (current) use of antibiotics: Secondary | ICD-10-CM | POA: Insufficient documentation

## 2015-03-28 DIAGNOSIS — Y998 Other external cause status: Secondary | ICD-10-CM | POA: Insufficient documentation

## 2015-03-28 DIAGNOSIS — Z720 Tobacco use: Secondary | ICD-10-CM | POA: Insufficient documentation

## 2015-03-28 DIAGNOSIS — L03116 Cellulitis of left lower limb: Secondary | ICD-10-CM | POA: Insufficient documentation

## 2015-03-28 MED ORDER — LIDOCAINE HCL (PF) 1 % IJ SOLN
INTRAMUSCULAR | Status: AC
  Filled 2015-03-28: qty 5

## 2015-03-28 MED ORDER — LIDOCAINE HCL (PF) 1 % IJ SOLN
2.1000 mL | Freq: Once | INTRAMUSCULAR | Status: AC
  Administered 2015-03-28: 2.1 mL

## 2015-03-28 MED ORDER — CEPHALEXIN 500 MG PO CAPS: 500.0000 mg | ORAL_CAPSULE | Freq: Four times a day (QID) | ORAL | Status: DC

## 2015-03-28 MED ORDER — CEFTRIAXONE SODIUM 1 G IJ SOLR
1.0000 g | Freq: Once | INTRAMUSCULAR | Status: AC
  Administered 2015-03-28: 1 g via INTRAMUSCULAR
  Filled 2015-03-28: qty 10

## 2015-03-28 NOTE — Discharge Instructions (Signed)
Rest, Ice intermittently (in the first 24-48 hours), Gentle compression with an Ace wrap, and elevate (Limb above the level of the heart)   Take up to  of ibuprofen (that is usually 4 over the counter pills)  3 times a day for 5 days. Take with food.  Take your antibiotics as directed and to completion. You should never have any leftover antibiotics! Push fluids and stay well hydrated.   If you see signs of infection (warmth, redness, tenderness, pus, sharp increase in pain, fever, red streaking) immediately return to the emergency department.  Please follow with your primary care doctor in the next 2 days for a check-up. They must obtain records for further management.   Do not hesitate to return to the Emergency Department for any new, worsening or concerning symptoms.    Cellulitis Cellulitis is an infection of the skin and the tissue beneath it. The infected area is usually red and tender. Cellulitis occurs most often in the arms and lower legs.  CAUSES  Cellulitis is caused by bacteria that enter the skin through cracks or cuts in the skin. The most common types of bacteria that cause cellulitis are staphylococci and streptococci. SIGNS AND SYMPTOMS   Redness and warmth.  Swelling.  Tenderness or pain.  Fever. DIAGNOSIS  Your health care provider can usually determine what is wrong based on a physical exam. Blood tests may also be done. TREATMENT  Treatment usually involves taking an antibiotic medicine. HOME CARE INSTRUCTIONS   Take your antibiotic medicine as directed by your health care provider. Finish the antibiotic even if you start to feel better.  Keep the infected arm or leg elevated to reduce swelling.  Apply a warm cloth to the affected area up to 4 times per day to relieve pain.  Take medicines only as directed by your health care provider.  Keep all follow-up visits as directed by your health care provider. SEEK MEDICAL CARE IF:   You notice red  streaks coming from the infected area.  Your red area gets larger or turns dark in color.  Your bone or joint underneath the infected area becomes painful after the skin has healed.  Your infection returns in the same area or another area.  You notice a swollen bump in the infected area.  You develop new symptoms.  You have a fever. SEEK IMMEDIATE MEDICAL CARE IF:   You feel very sleepy.  You develop vomiting or diarrhea.  You have a general ill feeling (malaise) with muscle aches and pains. MAKE SURE YOU:   Understand these instructions.  Will watch your condition.  Will get help right away if you are not doing well or get worse. Document Released: 09/23/2005 Document Revised: 04/30/2014 Document Reviewed: 02/29/2012 [facility] Patient Information 2015 [place], [place]. This information is not intended to replace advice given to you by your health care provider. Make sure you discuss any questions you have with your health care provider.  Cellulitis Cellulitis is an infection of the skin and the tissue beneath it. The infected area is usually red and tender. Cellulitis occurs most often in the arms and lower legs.  CAUSES  Cellulitis is caused by bacteria that enter the skin through cracks or cuts in the skin. The most common types of bacteria that cause cellulitis are staphylococci and streptococci. SIGNS AND SYMPTOMS   Redness and warmth.  Swelling.  Tenderness or pain.  Fever. DIAGNOSIS  Your health care provider can usually determine what is wrong based on  a physical exam. Blood tests may also be done. TREATMENT  Treatment usually involves taking an antibiotic medicine. HOME CARE INSTRUCTIONS   Take your antibiotic medicine as directed by your health care provider. Finish the antibiotic even if you start to feel better.  Keep the infected arm or leg elevated to reduce swelling.  Apply a warm cloth to the affected area up to 4 times per day to relieve  pain.  Take medicines only as directed by your health care provider.  Keep all follow-up visits as directed by your health care provider. SEEK MEDICAL CARE IF:   You notice red streaks coming from the infected area.  Your red area gets larger or turns dark in color.  Your bone or joint underneath the infected area becomes painful after the skin has healed.  Your infection returns in the same area or another area.  You notice a swollen bump in the infected area.  You develop new symptoms.  You have a fever. SEEK IMMEDIATE MEDICAL CARE IF:   You feel very sleepy.  You develop vomiting or diarrhea.  You have a general ill feeling (malaise) with muscle aches and pains. MAKE SURE YOU:   Understand these instructions.  Will watch your condition.  Will get help right away if you are not doing well or get worse. Document Released: 09/23/2005 Document Revised: 04/30/2014 Document Reviewed: 02/29/2012 [facility] Patient Information 2015 [place]. This information is not intended to replace advice given to you by your health care provider. Make sure you discuss any questions you have with your health care provider.

## 2015-03-28 NOTE — ED Notes (Signed)
Pt reports a possible spider bite to the left medial ankle and on top of left foot.

## 2015-03-28 NOTE — ED Provider Notes (Signed)
CSN: [id number]     Arrival date & time 03/28/15  1814 History  This chart was scribed for non-physician practitioner Wynetta EmeryNicole Charon Akamine, PA-C working with Vanetta MuldersScott Zackowski, MD by Murriel HopperAlec Branch, ED Scribe. This patient was seen in room TR07C/TR07C and the patient's care was started at 7:03 PM.    Chief Complaint  Patient presents with  . Insect Bite      The history is provided by the patient. No language interpreter was used.     HPI Comments: Steve Branch is a 24 y.o. male who presents to the Emergency Department complaining of a worsening insect bite to the top of the left foot with associated swelling and rash that has been present for two days. Pt rates his current pain as 5/10 in severity while he is ambulating, but states it does not hurt at all when he is sitting down. Pt states that he thinks he may have been bitten by an insect, but notes that he does not remember a specific moment when the incident occurred. Pt states he did not see anything bite him. Pt denies taking any pain medication at home. Pt denies calf pain.    History reviewed. No pertinent past medical history. History reviewed. No pertinent past surgical history. History reviewed. No pertinent family history. History  Substance Use Topics  . Smoking status: Current Every Day Smoker -- 0.50 packs/day    Types: Cigarettes  . Smokeless tobacco: Not on file  . Alcohol Use: No    Review of Systems  Skin: Positive for color change and rash.   10 systems reviewed and found to be negative, except as noted in the HPI.    Allergies  Review of patient's allergies indicates no known allergies.  Home Medications   Prior to Admission medications   Medication Sig Start Date End Date Taking? Authorizing Provider  ibuprofen (ADVIL,MOTRIN) 200 MG tablet Take 400 mg by mouth every 6 (six) hours as needed (for pain/swelling).    Historical Provider, MD  meloxicam (MOBIC) 15 MG tablet Take 1 tablet (15 mg total) by mouth  daily. 07/11/14   Kaitlyn Szekalski, PA-C  penicillin v potassium (VEETID) 500 MG tablet Take 1 tablet (500 mg total) by mouth 4 (four) times daily. 07/11/14   Kaitlyn Szekalski, PA-C   BP 130/64 mmHg  Pulse 87  Temp(Src) 98.8 F (37.1 C) (Oral)  Resp 16  SpO2 98% Physical Exam  Constitutional: He is oriented to person, place, and time. He appears well-developed and well-nourished. No distress.  HENT:  Head: Normocephalic.  Eyes: Conjunctivae and EOM are normal.  Cardiovascular: Normal rate.   Pulmonary/Chest: Effort normal. No stridor.  Musculoskeletal: Normal range of motion.       Feet:  Neurological: He is alert and oriented to person, place, and time.  Skin: Rash noted.  Patient has insect bite to the dorsum of the left foot. There is a surrounding cellulitis as diagrammed.  Psychiatric: He has a normal mood and affect.  Nursing note and vitals reviewed.   ED Course  Procedures (including critical care time)  DIAGNOSTIC STUDIES: Oxygen Saturation is 98% on room air , normal by my interpretation.    COORDINATION OF CARE: 7:04 PM Discussed treatment plan with pt at bedside and pt agreed to plan.   Labs Review Labs Reviewed - No data to display  Imaging Review No results found.   EKG Interpretation None      MDM   Final diagnoses:  Cellulitis of left lower leg  Filed Vitals:   03/28/15 1837 03/28/15 1943  BP: 130/64 130/76  Pulse: 87 85  Temp: 98.8 F (37.1 C) 98.8 F (37.1 C)  TempSrc: Oral Oral  Resp: 16 18  SpO2: 98% 99%    Medications  cefTRIAXone (ROCEPHIN) injection 1 g (1 g Intramuscular Given 03/28/15 1934)  lidocaine (PF) (XYLOCAINE) 1 % injection (  Duplicate 03/28/15 1933)  lidocaine (PF) (XYLOCAINE) 1 % injection 2.1 mL (2.1 mLs Infiltration Given 03/28/15 1935)    [name] is a pleasant 24 y.o. male presenting with a lightest to the left ankle left face secondary to insect bite. No signs of systemic infection, she will be  given a gram of Rocephin IM and started on Keflex. We've had an extensive discussion of return precautions and patient verbalizes understanding.  Evaluation does not show pathology that would require ongoing emergent intervention or inpatient treatment. Pt is hemodynamically stable and mentating appropriately. Discussed findings and plan with patient/guardian, who agrees with care plan. All questions answered. Return precautions discussed and outpatient follow up given.   Discharge Medication List as of 03/28/2015  7:27 PM    START taking these medications   Details  cephALEXin (KEFLEX) 500 MG capsule Take 1 capsule (500 mg total) by mouth 4 (four) times daily., Starting 03/28/2015, Until Discontinued, [employer], PA-C 03/29/15 0122  Vanetta Mulders, MD 04/02/15 [phone]

## 2015-04-01 ENCOUNTER — Emergency Department (HOSPITAL_COMMUNITY)
Admission: EM | Admit: 2015-04-01 | Discharge: 2015-04-01 | Discharge status: Against Medical Advice | Payer: Self-pay | Attending: Emergency Medicine | Admitting: Emergency Medicine

## 2015-04-01 ENCOUNTER — Encounter (HOSPITAL_COMMUNITY): Payer: Self-pay

## 2015-04-01 DIAGNOSIS — Y998 Other external cause status: Secondary | ICD-10-CM | POA: Insufficient documentation

## 2015-04-01 DIAGNOSIS — T407X4A Poisoning by cannabis (derivatives), undetermined, initial encounter: Secondary | ICD-10-CM | POA: Insufficient documentation

## 2015-04-01 DIAGNOSIS — Y9289 Other specified places as the place of occurrence of the external cause: Secondary | ICD-10-CM | POA: Insufficient documentation

## 2015-04-01 DIAGNOSIS — R200 Anesthesia of skin: Secondary | ICD-10-CM | POA: Insufficient documentation

## 2015-04-01 DIAGNOSIS — Z720 Tobacco use: Secondary | ICD-10-CM | POA: Insufficient documentation

## 2015-04-01 DIAGNOSIS — X58XXXA Exposure to other specified factors, initial encounter: Secondary | ICD-10-CM | POA: Insufficient documentation

## 2015-04-01 DIAGNOSIS — Y9389 Activity, other specified: Secondary | ICD-10-CM | POA: Insufficient documentation

## 2015-04-01 NOTE — ED Notes (Signed)
No response when called for a third and final time for room.

## 2015-04-01 NOTE — ED Notes (Signed)
Per EMS, Pt c/o "feeling like he is gonna die" and bilateral lower and upper extremity numbness after smoking "5 or 6 pulls" of marijuana and drinking a cap full of ETOH.  Pt reports the marijuana was "Loud, which is the good stuff."  NAD noted.  Equal grips and strong pulses noted.

## 2015-04-01 NOTE — ED Notes (Signed)
Pt did not answer for room when called x one.

## 2015-04-01 NOTE — ED Notes (Signed)
2nd call for patient from lobby with no response.

## 2016-02-22 ENCOUNTER — Emergency Department (HOSPITAL_COMMUNITY)
Admission: EM | Admit: 2016-02-22 | Discharge: 2016-02-22 | Disposition: A | Discharge status: Home / Self Care | Payer: No Typology Code available for payment source | Attending: Emergency Medicine | Admitting: Emergency Medicine

## 2016-02-22 ENCOUNTER — Encounter (HOSPITAL_COMMUNITY): Payer: Self-pay | Admitting: *Deleted

## 2016-02-22 DIAGNOSIS — Z791 Long term (current) use of non-steroidal anti-inflammatories (NSAID): Secondary | ICD-10-CM | POA: Insufficient documentation

## 2016-02-22 DIAGNOSIS — L249 Irritant contact dermatitis, unspecified cause: Secondary | ICD-10-CM

## 2016-02-22 DIAGNOSIS — Z792 Long term (current) use of antibiotics: Secondary | ICD-10-CM | POA: Insufficient documentation

## 2016-02-22 DIAGNOSIS — L2489 Irritant contact dermatitis due to other agents: Secondary | ICD-10-CM | POA: Insufficient documentation

## 2016-02-22 DIAGNOSIS — F17210 Nicotine dependence, cigarettes, uncomplicated: Secondary | ICD-10-CM | POA: Insufficient documentation

## 2016-02-22 NOTE — Discharge Instructions (Signed)
Contact Dermatitis Dermatitis is redness, soreness, and swelling (inflammation) of the skin. Contact dermatitis is a reaction to certain substances that touch the skin. There are two types of contact dermatitis:   Irritant contact dermatitis. This type is caused by something that irritates your skin, such as dry hands from washing them too much. This type does not require previous exposure to the substance for a reaction to occur. This type is more common.  Allergic contact dermatitis. This type is caused by a substance that you are allergic to, such as a nickel allergy or poison ivy. This type only occurs if you have been exposed to the substance (allergen) before. Upon a repeat exposure, your body reacts to the substance. This type is less common. CAUSES  Many different substances can cause contact dermatitis. Irritant contact dermatitis is most commonly caused by exposure to:   Makeup.   Soaps.   Detergents.   Bleaches.   Acids.   Metal salts, such as nickel.  Allergic contact dermatitis is most commonly caused by exposure to:   Poisonous plants.   Chemicals.   Jewelry.   Latex.   Medicines.   Preservatives in products, such as clothing.  RISK FACTORS This condition is more likely to develop in:   People who have jobs that expose them to irritants or allergens.  People who have certain medical conditions, such as asthma or eczema.  SYMPTOMS  Symptoms of this condition may occur anywhere on your body where the irritant has touched you or is touched by you. Symptoms include:  Dryness or flaking.   Redness.   Cracks.   Itching.   Pain or a burning feeling.   Blisters.  Drainage of small amounts of blood or clear fluid from skin cracks. With allergic contact dermatitis, there may also be swelling in areas such as the eyelids, mouth, or genitals.  DIAGNOSIS  This condition is diagnosed with a medical history and physical exam. A patch skin test  may be performed to help determine the cause. If the condition is related to your job, you may need to see an occupational medicine specialist. TREATMENT Treatment for this condition includes figuring out what caused the reaction and protecting your skin from further contact. Treatment may also include:   Steroid creams or ointments. Oral steroid medicines may be needed in more severe cases.  Antibiotics or antibacterial ointments, if a skin infection is present.  Antihistamine lotion or an antihistamine taken by mouth to ease itching.  A bandage (dressing). HOME CARE INSTRUCTIONS Skin Care  Moisturize your skin as needed.   Apply cool compresses to the affected areas.  Try taking a bath with:  Epsom salts. Follow the instructions on the packaging. You can get these at your local pharmacy or grocery store.  Baking soda. Pour a small amount into the bath as directed by your health care provider.  Colloidal oatmeal. Follow the instructions on the packaging. You can get this at your local pharmacy or grocery store.  Try applying baking soda paste to your skin. Stir water into baking soda until it reaches a paste-like consistency.  Do not scratch your skin.  Bathe less frequently, such as every other day.  Bathe in lukewarm water. Avoid using hot water. Medicines  Take or apply over-the-counter and prescription medicines only as told by your health care provider.   If you were prescribed an antibiotic medicine, take or apply your antibiotic as told by your health care provider. Do not stop using the   antibiotic even if your condition starts to improve. General Instructions  Keep all follow-up visits as told by your health care provider. This is important.  Avoid the substance that caused your reaction. If you do not know what caused it, keep a journal to try to track what caused it. Write down:  What you eat.  What cosmetic products you use.  What you drink.  What  you wear in the affected area. This includes jewelry.  If you were given a dressing, take care of it as told by your health care provider. This includes when to change and remove it. SEEK MEDICAL CARE IF:   Your condition does not improve with treatment.  Your condition gets worse.  You have signs of infection such as swelling, tenderness, redness, soreness, or warmth in the affected area.  You have a fever.  You have new symptoms. SEEK IMMEDIATE MEDICAL CARE IF:   You have a severe headache, neck pain, or neck stiffness.  You vomit.  You feel very sleepy.  You notice red streaks coming from the affected area.  Your bone or joint underneath the affected area becomes painful after the skin has healed.  The affected area turns darker.  You have difficulty breathing.   This information is not intended to replace advice given to you by your health care provider. Make sure you discuss any questions you have with your health care provider.   Document Released: 12/11/2000 Document Revised: 09/04/2015 Document Reviewed: 05/01/2015 Elsevier Interactive Patient Education 2016 [employer]  

## 2016-02-22 NOTE — ED Notes (Signed)
Pt reports a rash to hands and arms . Pt reports using a new after shave lotion on Friday.

## 2016-02-22 NOTE — ED Provider Notes (Signed)
CSN: [id number]     Arrival date & time 02/22/16  1191 History  By signing my name below, I, Soijett Blue, attest that this documentation has been prepared under the direction and in the presence of Cheri Fowler, PA-C Electronically Signed: Soijett Blue, ED Scribe. 02/22/2016. 10:03 AM.     Chief Complaint  Patient presents with  . Rash      The history is provided by the patient. No language interpreter was used.    Steve Branch is a 25 y.o. male who presents to the Emergency Department complaining of non-pruritic rash to bilateral hands, arms, and face x yesterday. Pt denies the rash itching, burning, or being painful at this time. Pt notes that the rash occurred following use of a new aftershave lotion yesterday.  Pt denies new detergents/medications/pets/environment. He notes that he has not tried any medicaitons for the relief of his symptoms. He denies fever, chills, n/v, color change, and any other symptoms. Denies allergies to medications.    History reviewed. No pertinent past medical history. History reviewed. No pertinent past surgical history. History reviewed. No pertinent family history. Social History  Substance Use Topics  . Smoking status: Current Every Day Smoker -- 0.50 packs/day    Types: Cigarettes  . Smokeless tobacco: None  . Alcohol Use: No    Review of Systems  Constitutional: Negative for fever and chills.  Gastrointestinal: Negative for nausea and vomiting.  Skin: Positive for rash. Negative for color change.  All other systems reviewed and are negative.     Allergies  Review of patient's allergies indicates no known allergies.  Home Medications   Prior to Admission medications   Medication Sig Start Date End Date Taking? Authorizing Provider  cephALEXin (KEFLEX) 500 MG capsule Take 1 capsule (500 mg total) by mouth 4 (four) times daily. 03/28/15  Yes Nicole Pisciotta, PA-C  ibuprofen (ADVIL,MOTRIN) 200 MG tablet Take 400 mg by mouth every 6  (six) hours as needed (for pain/swelling).   Yes Historical Provider, MD  meloxicam (MOBIC) 15 MG tablet Take 1 tablet (15 mg total) by mouth daily. 07/11/14  Yes Kaitlyn Szekalski, PA-C  penicillin v potassium (VEETID) 500 MG tablet Take 1 tablet (500 mg total) by mouth 4 (four) times daily. 07/11/14  Yes Kaitlyn Szekalski, PA-C   There were no vitals taken for this visit. Physical Exam  Constitutional: He is oriented to person, place, and time. He appears well-developed and well-nourished.  Non-toxic appearance. He does not have a sickly appearance. He does not appear ill.  HENT:  Head: Normocephalic and atraumatic.  Eyes: Conjunctivae are normal.  Neck: Normal range of motion.  Cardiovascular:  No murmur heard. Pulmonary/Chest: Effort normal. No accessory muscle usage or stridor. No respiratory distress. He has no wheezes. He has no rhonchi. He has no rales.  Abdominal: He exhibits no distension.  Musculoskeletal: Normal range of motion.  Neurological: He is alert and oriented to person, place, and time.  Speech clear without dysarthria.  Skin: Skin is warm and dry.  Flesh colored maculopapular rash over hands and upper extremities.  No weeping or vesicles.  No signs of infection.  Psychiatric: He has a normal mood and affect. His behavior is normal.  Nursing note and vitals reviewed.   ED Course  Procedures (including critical care time) DIAGNOSTIC STUDIES: Oxygen Saturation is 99% on room air, normal by my interpretation.    COORDINATION OF CARE: 10:01 AM Discussed treatment plan with pt at bedside which includes use benadryl PRN,  d/c use of aftershave lotion and pt agreed to plan.    Labs Review Labs Reviewed - No data to display  Imaging Review No results found.    EKG Interpretation None      MDM   Final diagnoses:  Irritant contact dermatitis    Patient with irritated contact dermatitis. Instructed to avoid offending agent and to use unscented soaps,  lotions, and detergents. Will treat with benadryl PRN.  No signs of secondary infection. Follow up with PCP. Return precautions discussed. Pt is safe for discharge at this time.  I personally performed the services described in this documentation, which was scribed in my presence. The recorded information has been reviewed and is accurate.    Cheri Fowler, PA-C 02/22/16 1427  Alvira Monday, MD 02/23/16 [phone]

## 2016-02-22 NOTE — ED Notes (Signed)
Declined W/C at D/C and was escorted to lobby by RN. 

## 2016-06-08 ENCOUNTER — Emergency Department (HOSPITAL_COMMUNITY)
Admission: EM | Admit: 2016-06-08 | Discharge: 2016-06-08 | Disposition: A | Discharge status: Home / Self Care | Payer: Self-pay | Attending: Emergency Medicine | Admitting: Emergency Medicine

## 2016-06-08 ENCOUNTER — Emergency Department (HOSPITAL_COMMUNITY): Payer: Self-pay

## 2016-06-08 ENCOUNTER — Encounter (HOSPITAL_COMMUNITY): Payer: Self-pay | Admitting: Emergency Medicine

## 2016-06-08 DIAGNOSIS — F17210 Nicotine dependence, cigarettes, uncomplicated: Secondary | ICD-10-CM | POA: Insufficient documentation

## 2016-06-08 DIAGNOSIS — M545 Low back pain, unspecified: Secondary | ICD-10-CM

## 2016-06-08 DIAGNOSIS — M542 Cervicalgia: Secondary | ICD-10-CM | POA: Insufficient documentation

## 2016-06-08 NOTE — ED Provider Notes (Signed)
CSN: [id number]     Arrival date & time 06/08/16  1332 History  By signing my name below, I, Majel Homer, attest that this documentation has been prepared under the direction and in the presence of non-physician practitioner, Terance Hart, PA-C. Electronically Signed: Majel Homer, Scribe. 06/08/2016. 3:56 PM.  Chief Complaint  Patient presents with  . Back Pain   The history is provided by the patient. No language interpreter was used.  HPI Comments:  Steve Branch is a 25 y.o. male who presents to the Emergency Department complaining of gradual onset, moderate, back pain s/p a fall that occurred two days ago. Pt states he fell backwards on his buttocks; however, he denies hitting his head upon falling. He states he has never had an injury to his back before. He notes an associated symptom of neck pain when he moves his head backwards. He reports he has taken Tylenol with moderate relief. He denies weakness, numbness in his groin and extremities, tingling, difficulty walking, bladder or bowel incontinence, and difficulty urinating.   History reviewed. No pertinent past medical history. History reviewed. No pertinent past surgical history. No family history on file. Social History  Substance Use Topics  . Smoking status: Current Every Day Smoker -- 0.50 packs/day    Types: Cigarettes  . Smokeless tobacco: None  . Alcohol Use: No    Review of Systems  Genitourinary: Negative for difficulty urinating.  Musculoskeletal: Positive for myalgias, back pain and neck pain.  Neurological: Negative for weakness and numbness.    Allergies  Review of patient's allergies indicates no known allergies.  Home Medications   Prior to Admission medications   Medication Sig Start Date End Date Taking? Authorizing Provider  cephALEXin (KEFLEX) 500 MG capsule Take 1 capsule (500 mg total) by mouth 4 (four) times daily. 03/28/15   Nicole Pisciotta, PA-C  ibuprofen (ADVIL,MOTRIN) 200 MG tablet Take 400 mg  by mouth every 6 (six) hours as needed (for pain/swelling).    Historical Provider, MD  meloxicam (MOBIC) 15 MG tablet Take 1 tablet (15 mg total) by mouth daily. 07/11/14   Kaitlyn Szekalski, PA-C  penicillin v potassium (VEETID) 500 MG tablet Take 1 tablet (500 mg total) by mouth 4 (four) times daily. 07/11/14   Emilia Beck, PA-C   Triage Vitals: BP 124/69 mmHg  Pulse 89  Temp(Src) 99 F (37.2 C) (Oral)  Resp 16  Ht  (1.753 m)  Wt 220 lb (99.791 kg)  BMI 32.47 kg/m2  SpO2 94%  Physical Exam  Constitutional: He is oriented to person, place, and time. He appears well-developed and well-nourished. No distress.  HENT:  Head: Normocephalic and atraumatic.  Eyes: Conjunctivae are normal. Pupils are equal, round, and reactive to light. Right eye exhibits no discharge. Left eye exhibits no discharge. No scleral icterus.  Neck: Normal range of motion.  No C-spine tenderness  Cardiovascular: Normal rate.   Pulmonary/Chest: Effort normal. No respiratory distress.  Abdominal: Soft. He exhibits no distension.  Musculoskeletal:  Back: Inspection: No masses, deformity, or rash Palpation: No midline spinal tenderness. Mild right lumbar paraspinal muscle tenderness. ROM: Normal flexion, extension, lateral rotation and flexion of back.  Strength: 5/5 in lower extremities and normal plantar and dorsiflexion Sensation: Intact sensation with light touch in lower extremities bilaterally Gait: Normal gait Reflexes: Patellar reflex is 2+ bilaterally, Achilles is 2+ bilaterally SLR: Negative seated straight leg raise   Neurological: He is alert and oriented to person, place, and time.  Skin: Skin is warm  and dry.  Psychiatric: He has a normal mood and affect.    ED Course  Procedures  DIAGNOSTIC STUDIES:  Oxygen Saturation is 94% on RA, adequate by my interpretation.    COORDINATION OF CARE:  3:53 PM Discussed treatment plan, which includes back X-ray with pt at bedside and pt  agreed to plan.  Labs Review Labs Reviewed - No data to display  Imaging Review Dg Cervical Spine 2-3 Views  06/08/2016  CLINICAL DATA:  Slip and fall 2 days ago with neck pain, initial encounter EXAM: CERVICAL SPINE - 3 VIEW COMPARISON:  None. FINDINGS: There is no evidence of cervical spine fracture or prevertebral soft tissue swelling. Alignment is normal. No other significant bone abnormalities are identified. IMPRESSION: No acute abnormality noted. Electronically Signed   By: Alcide CleverMark  Lukens M.D.   On: 06/08/2016 16:21   Dg Lumbar Spine Complete  06/08/2016  CLINICAL DATA:  Trip and fall 2 days ago with low back pain, initial encounter EXAM: LUMBAR SPINE - COMPLETE 4+ VIEW COMPARISON:  None. FINDINGS: Five lumbar type vertebral bodies are well visualized. Vertebral body height is well maintained. No pars defects are seen. No spondylolisthesis is noted. No soft tissue abnormality is noted. IMPRESSION: No acute abnormality seen. Electronically Signed   By: Alcide CleverMark  Lukens M.D.   On: 06/08/2016 16:21   I have personally reviewed and evaluated these images and lab results as part of my medical decision-making.  MDM   Final diagnoses:  Neck pain  Right-sided low back pain without sciatica   25 year old male with back pain after a fall. No fever, syncope, unexplained weight loss, hx of cancer, loss of bowel/bladder function, saddle anesthesia, urinary retention, IVDU. Xray obtained due to hx of trauma which was negative. Discussed conservative measures for treatment of symptoms. Patient is NAD, non-toxic, with stable VS. Patient is informed of clinical course, understands medical decision making process, and agrees with plan. Opportunity for questions provided and all questions answered. Return precautions given.  I personally performed the services described in this documentation, which was scribed in my presence. The recorded information has been reviewed and is accurate.    Bethel BornKelly Marie Gekas,  PA-C 06/08/16 2014  Lyndal Pulleyaniel Knott, MD 06/09/16 [phone]

## 2016-06-08 NOTE — ED Notes (Signed)
Patient states that he "slipped and fell 2 days ago and wants to get checked out" Patient is complaining of back pain after fall. Patient ambulatory to triage. Alert, oriented and no acute distress.

## 2016-06-08 NOTE — ED Notes (Signed)
Patient transported to X-ray 

## 2016-06-08 NOTE — Discharge Instructions (Signed)

## 2016-07-12 ENCOUNTER — Ambulatory Visit (HOSPITAL_COMMUNITY)
Admission: EM | Admit: 2016-07-12 | Discharge: 2016-07-12 | Disposition: A | Discharge status: Home / Self Care | Payer: No Typology Code available for payment source | Attending: Emergency Medicine | Admitting: Emergency Medicine

## 2016-07-12 ENCOUNTER — Encounter (HOSPITAL_COMMUNITY): Payer: Self-pay | Admitting: Emergency Medicine

## 2016-07-12 DIAGNOSIS — S8392XA Sprain of unspecified site of left knee, initial encounter: Secondary | ICD-10-CM

## 2016-07-12 DIAGNOSIS — S93402A Sprain of unspecified ligament of left ankle, initial encounter: Secondary | ICD-10-CM

## 2016-07-12 DIAGNOSIS — L301 Dyshidrosis [pompholyx]: Secondary | ICD-10-CM

## 2016-07-12 MED ORDER — CLOBETASOL PROPIONATE 0.05 % EX OINT: 1.0000 "application " | TOPICAL_OINTMENT | Freq: Two times a day (BID) | CUTANEOUS | Status: DC

## 2016-07-12 NOTE — Discharge Instructions (Signed)
You likely sprained your ankle and knee when you fell last week. The pain you get when you are on your feet for a long time is coming from the healing ligaments. Wear the knee sleeve and ankle brace when you are going to be on your feet. Try to limit standing to no more than 4 hours at a time.  The rash on your hands and face is a consequence of the allergic reaction to the product you used. Apply the clobetasol ointment to your hands twice a day. Get a thick lotion such as cocoa butter or coconut oil and apply this frequently to your face - at least 3 times a day. The rash should improve over the next week.  Follow-up as needed.

## 2016-07-12 NOTE — ED Provider Notes (Signed)
CSN: [id number]     Arrival date & time 07/12/16  1900 History   First MD Initiated Contact with Patient 07/12/16 1933     Chief Complaint  Patient presents with  . Leg Pain   (Consider location/radiation/quality/duration/timing/severity/associated sxs/prior Treatment) HPI He is a 25 year old man here for evaluation of left knee and ankle pain. He states a week ago he fell at work, rolling his left ankle and landing on the left lateral knee. He states things are going well overall, but he has pain that goes from the left knee down to the ankle with prolonged standing. 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 He is able to bear weight without difficulty. No pain when at rest. He denies any continued swelling.  He also reports an itchy dry rash on his hands and face. He states he used an aftershave product and immediately had a action to it with bumps. He states the bumps did resolve, but now has this dry itchy skin.  History reviewed. No pertinent past medical history. History reviewed. No pertinent past surgical history. No family history on file. Social History  Substance Use Topics  . Smoking status: Current Every Day Smoker -- 0.50 packs/day    Types: Cigarettes  . Smokeless tobacco: None  . Alcohol Use: No    Review of Systems As in history of present illness Allergies  Review of patient's allergies indicates no known allergies.  Home Medications   Prior to Admission medications   Medication Sig Start Date End Date Taking? Authorizing Provider  clobetasol ointment (TEMOVATE) 0.05 % Apply 1 application topically 2 (two) times daily. To hands only. 07/12/16   Charm RingsErin J Nadiya Pieratt, MD  ibuprofen (ADVIL,MOTRIN) 200 MG tablet Take 400 mg by mouth every 6 (six) hours as needed (for pain/swelling).    Historical Provider, MD  meloxicam (MOBIC) 15 MG tablet Take 1 tablet (15 mg total) by mouth daily. 07/11/14   Emilia BeckKaitlyn Szekalski, PA-C   Meds Ordered and Administered this Visit   Medications - No data to display  BP 127/79 mmHg  Pulse 77  Temp(Src) 99 F (37.2 C) (Oral)  Resp 16  SpO2 100% No data found.   Physical Exam  Constitutional: He is oriented to person, place, and time. He appears well-developed and well-nourished. No distress.  Cardiovascular: Normal rate.   Pulmonary/Chest: Effort normal.  Musculoskeletal:  Left ankle: No erythema or edema. No point tenderness. Full active range of motion. Left knee: No erythema or edema. No joint effusion. No palpable tenderness. Full active range of motion.  Neurological: He is alert and oriented to person, place, and time.  Skin:  He has dry thickened skin on his hands, particularly the radial aspects.  He also has a dry scaly rash on his face, most prominent around the periphery and on the left neck.    ED Course  Procedures (including critical care time)  Labs Review Labs Reviewed - No data to display  Imaging Review No results found.   MDM   1. Left ankle sprain, initial encounter   2. Left knee sprain, initial encounter   3. Dyshidrotic dermatitis    Knee sleeve and ASO given for healing ankle and knee sprains. Work note provided to limit standing to no more than 4 hours. Rash on hands and face is consistent with eczema. Clobetasol for the hands. Recommended frequent application of cocoa butter lotion or coconut oil to the face. Follow-up as needed.  Charm Rings, MD 07/12/16 2012

## 2016-07-12 NOTE — ED Notes (Signed)
Patient complains of left knee and left ankle pain after tripping on some mulch on 7/13.  Patient ambulating without difficulty

## 2016-07-30 ENCOUNTER — Encounter (HOSPITAL_COMMUNITY): Payer: Self-pay | Admitting: *Deleted

## 2016-07-30 ENCOUNTER — Emergency Department (HOSPITAL_COMMUNITY)
Admission: EM | Admit: 2016-07-30 | Discharge: 2016-07-30 | Disposition: A | Discharge status: Home / Self Care | Payer: Self-pay | Attending: Emergency Medicine | Admitting: Emergency Medicine

## 2016-07-30 ENCOUNTER — Emergency Department (HOSPITAL_COMMUNITY): Payer: Self-pay

## 2016-07-30 DIAGNOSIS — Y990 Civilian activity done for income or pay: Secondary | ICD-10-CM | POA: Insufficient documentation

## 2016-07-30 DIAGNOSIS — Y929 Unspecified place or not applicable: Secondary | ICD-10-CM | POA: Insufficient documentation

## 2016-07-30 DIAGNOSIS — S93402A Sprain of unspecified ligament of left ankle, initial encounter: Secondary | ICD-10-CM | POA: Insufficient documentation

## 2016-07-30 DIAGNOSIS — S93402D Sprain of unspecified ligament of left ankle, subsequent encounter: Secondary | ICD-10-CM

## 2016-07-30 DIAGNOSIS — F17210 Nicotine dependence, cigarettes, uncomplicated: Secondary | ICD-10-CM | POA: Insufficient documentation

## 2016-07-30 DIAGNOSIS — X509XXA Other and unspecified overexertion or strenuous movements or postures, initial encounter: Secondary | ICD-10-CM | POA: Insufficient documentation

## 2016-07-30 DIAGNOSIS — Y939 Activity, unspecified: Secondary | ICD-10-CM | POA: Insufficient documentation

## 2016-07-30 DIAGNOSIS — W172XXA Fall into hole, initial encounter: Secondary | ICD-10-CM | POA: Insufficient documentation

## 2016-07-30 MED ORDER — DICLOFENAC SODIUM 50 MG PO TBEC: 50.0000 mg | DELAYED_RELEASE_TABLET | Freq: Two times a day (BID) | ORAL | 0 refills | Status: DC

## 2016-07-30 NOTE — ED Notes (Signed)
Ortho paged. 

## 2016-07-30 NOTE — ED Triage Notes (Signed)
Pt states he sprained his left leg 2 weeks ago stepping into a pot hole. States his leg has been hurting since. States he has not taken anything for the pain. States he stands a lot at work and at times he feels like his leg wants to give out.

## 2016-07-30 NOTE — ED Provider Notes (Signed)
[facility]EMER[facility]ENCY DEPT Provider Note   CSN: [id number] Arrival date & time: 07/30/16  1538  First Provider Contact:  None       History   Chief Complaint Chief Complaint  Patient presents with  . Leg Pain    HPI Steve Branch is a 25 y.o. male.  Pt turned ankle on 7/16. Pt reports he has continued having pain after he stnads for a long time.  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     The history is provided by the patient. No language interpreter was used.  Leg Pain   This is a new problem. Episode onset: 3 weeks. The problem occurs constantly. The pain is present in the left ankle. The quality of the pain is described as aching. The pain is mild. Associated symptoms include stiffness. He has tried nothing for the symptoms. The treatment provided no relief. There has been a history of trauma. Family history is significant for no gout.    No past medical history on file.  Patient Active Problem List   Diagnosis Date Noted  . FIBRILLATION, ATRIAL 03/20/2010    History reviewed. No pertinent surgical history.     Home Medications    Prior to Admission medications   Medication Sig Start Date End Date Taking? Authorizing Provider  clobetasol ointment (TEMOVATE) 0.05 % Apply 1 application topically 2 (two) times daily. To hands only. 07/12/16   Charm Rings, MD  ibuprofen (ADVIL,MOTRIN) 200 MG tablet Take 400 mg by mouth every 6 (six) hours as needed (for pain/swelling).    Historical Provider, MD  meloxicam (MOBIC) 15 MG tablet Take 1 tablet (15 mg total) by mouth daily. 07/11/14   Emilia Beck, PA-C    Family History No family history on file.  Social History Social History  Substance Use Topics  . Smoking status: Current Every Day Smoker    Packs/day: 0.50    Types: Cigarettes  . Smokeless tobacco: Never Used  . Alcohol use Yes     Comment: ocaasional.     Allergies   Review of patient's  allergies indicates no known allergies.   Review of Systems Review of Systems  Musculoskeletal: Positive for stiffness.  All other systems reviewed and are negative.    Physical Exam Updated Vital Signs BP 133/76 (BP Location: Left Arm)   Pulse 60   Temp 98.4 F (36.9 C) (Oral)   Resp 18   SpO2 100%   Physical Exam  Constitutional: He appears well-developed and well-nourished.  HENT:  Head: Normocephalic.  Eyes: Pupils are equal, round, and reactive to light.  Musculoskeletal: He exhibits tenderness.  No deformity, no swelling,  Pt able to walk without difficulty.   Neurological: He is alert.  Skin: Skin is warm.  Psychiatric: He has a normal mood and affect.  Nursing note and vitals reviewed.    ED Treatments / Results  Labs (all labs ordered are listed, but only abnormal results are displayed) Labs Reviewed - No data to display  EKG  EKG Interpretation None       Radiology Dg Ankle Complete Left  Result Date: 07/30/2016 CLINICAL DATA:  Intermittent pain and weakness in the left ankle after tripping injury 1 week prior. EXAM: LEFT ANKLE COMPLETE - 3+ VIEW COMPARISON:  None. FINDINGS: There is no evidence of fracture, dislocation, or joint effusion. There is no evidence of arthropathy or other focal bone abnormality.  Soft tissues are unremarkable. IMPRESSION: Negative radiographs of the left ankle. Electronically Signed   By: Rubye Oaks M.D.   On: 07/30/2016 19:51    Procedures Procedures (including critical care time)  Medications Ordered in ED Medications - No data to display   Initial Impression / Assessment and Plan / ED Course  I have reviewed the triage vital signs and the nursing notes.  Pertinent labs & imaging results that were available during my care of the patient were reviewed by me and considered in my medical decision making (see chart for details).  Clinical Course      Final Clinical Impressions(s) / ED Diagnoses   Final  diagnoses:  Ankle sprain, left, subsequent encounter    New Prescriptions New Prescriptions   No medications on file     Elson Areas, Cordelia Poche 07/30/16 2034    Zadie Rhine, MD 07/30/16 2118

## 2016-07-30 NOTE — ED Notes (Signed)
Pt well appearing, alert and oriented. Ambulates off unit .   

## 2016-07-30 NOTE — Discharge Instructions (Signed)
Schedule to see the Orthopaedist for evaluation.   Wear ASO splint for the next week.

## 2016-12-20 ENCOUNTER — Emergency Department (HOSPITAL_COMMUNITY)
Admission: EM | Admit: 2016-12-20 | Discharge: 2016-12-20 | Disposition: A | Discharge status: Home / Self Care | Payer: Self-pay | Attending: Emergency Medicine | Admitting: Emergency Medicine

## 2016-12-20 ENCOUNTER — Encounter (HOSPITAL_COMMUNITY): Payer: Self-pay

## 2016-12-20 DIAGNOSIS — S40011A Contusion of right shoulder, initial encounter: Secondary | ICD-10-CM | POA: Insufficient documentation

## 2016-12-20 DIAGNOSIS — Y939 Activity, unspecified: Secondary | ICD-10-CM | POA: Insufficient documentation

## 2016-12-20 DIAGNOSIS — Y999 Unspecified external cause status: Secondary | ICD-10-CM | POA: Insufficient documentation

## 2016-12-20 DIAGNOSIS — Z79899 Other long term (current) drug therapy: Secondary | ICD-10-CM | POA: Insufficient documentation

## 2016-12-20 DIAGNOSIS — Y929 Unspecified place or not applicable: Secondary | ICD-10-CM | POA: Insufficient documentation

## 2016-12-20 DIAGNOSIS — F17210 Nicotine dependence, cigarettes, uncomplicated: Secondary | ICD-10-CM | POA: Insufficient documentation

## 2016-12-20 DIAGNOSIS — W01198A Fall on same level from slipping, tripping and stumbling with subsequent striking against other object, initial encounter: Secondary | ICD-10-CM | POA: Insufficient documentation

## 2016-12-20 MED ORDER — NAPROXEN 500 MG PO TABS: 500.0000 mg | ORAL_TABLET | Freq: Two times a day (BID) | ORAL | 0 refills | Status: DC

## 2016-12-20 MED ORDER — IBUPROFEN 800 MG PO TABS
800.0000 mg | ORAL_TABLET | Freq: Once | ORAL | Status: AC
  Administered 2016-12-20: 800 mg via ORAL
  Filled 2016-12-20: qty 1

## 2016-12-20 MED ORDER — CYCLOBENZAPRINE HCL 10 MG PO TABS
10.0000 mg | ORAL_TABLET | Freq: Once | ORAL | Status: AC
  Administered 2016-12-20: 10 mg via ORAL
  Filled 2016-12-20: qty 1

## 2016-12-20 MED ORDER — CYCLOBENZAPRINE HCL 10 MG PO TABS: 10.0000 mg | ORAL_TABLET | Freq: Two times a day (BID) | ORAL | 0 refills | Status: DC | PRN

## 2016-12-20 NOTE — ED Triage Notes (Signed)
Onset 45 min PTA hit right upper arm on shelf, has pain from shoulder to elbow.  Able to move arm and shoulder WNL.

## 2016-12-20 NOTE — ED Provider Notes (Signed)
[facility] Provider Note    By signing my name below, I, Earmon PhoenixJennifer Waddell, attest that this documentation has been prepared under the direction and in the presence of [facility] Vaanya Shambaugh, [place]. Electronically Signed: Earmon PhoenixJennifer Waddell, ED Scribe. 12/20/16. 7:23 PM.    History   Chief Complaint Chief Complaint  Patient presents with  . Arm Injury    The history is provided by the patient and medical records. No language interpreter was used.    HPI Comments:  Steve Branch is a 25 y.o. male who presents to the Emergency Department complaining of an upper right arm injury that occurred about one hour ago. He states he slipped on some sugar and fell into a shelf. He did not fall down to the ground. He reports mild pain and describes it as tightness. He has not taken anything to treat his pain. He denies modifying factors. He denies bruising, wounds, head trauma, numbness, tingling or weakness of the RUE, right wrist or elbow injury.   History reviewed. No pertinent past medical history.  Patient Active Problem List   Diagnosis Date Noted  . FIBRILLATION, ATRIAL 03/20/2010    History reviewed. No pertinent surgical history.     Home Medications    Prior to Admission medications   Medication Sig Start Date End Date Taking? Authorizing Provider  clobetasol ointment (TEMOVATE) 0.05 % Apply 1 application topically 2 (two) times daily. To hands only. 07/12/16   Charm RingsErin J Honig, MD  cyclobenzaprine (FLEXERIL) 10 MG tablet Take 1 tablet (10 mg total) by mouth 2 (two) times daily as needed for muscle spasms. 12/20/16   Zyden Suman Orlene OchM Roddrick Sharron, NP  diclofenac (VOLTAREN) 50 MG EC tablet Take 1 tablet (50 mg total) by mouth 2 (two) times daily. 07/30/16   Elson AreasLeslie K Sofia, PA-C  ibuprofen (ADVIL,MOTRIN) 200 MG tablet Take 400 mg by mouth every 6 (six) hours as needed (for pain/swelling).    Historical Provider, MD  meloxicam (MOBIC) 15 MG tablet Take 1 tablet (15 mg total) by mouth daily. 07/11/14   Kaitlyn  Szekalski, PA-C  naproxen (NAPROSYN) 500 MG tablet Take 1 tablet (500 mg total) by mouth 2 (two) times daily. 12/20/16   Lutricia Widjaja Orlene OchM Kaeleigh Westendorf, NP    Family History History reviewed. No pertinent family history.  Social History Social History  Substance Use Topics  . Smoking status: Current Every Day Smoker    Packs/day: 0.50    Types: Cigarettes  . Smokeless tobacco: Never Used  . Alcohol use Yes     Comment: ocaasional.     Allergies   Patient has no known allergies.   Review of Systems Review of Systems  Musculoskeletal: Positive for myalgias.  Skin: Negative for color change and wound.  Neurological: Negative for weakness and numbness.  All other systems reviewed and are negative.    Physical Exam Updated Vital Signs BP 122/73   Pulse 96   Temp 98.2 F (36.8 C) (Oral)   Resp 18   Ht 5\' 10"  (1.778 m)   Wt 240 lb (108.9 kg)   SpO2 100%   BMI 34.44 kg/m   Physical Exam  Constitutional: He is oriented to person, place, and time. He appears well-developed and well-nourished.  Eyes: EOM are normal.  Neck: Neck supple.  Cardiovascular:  Radial pulses 2+ bilaterally. Adequate circulation.  Pulmonary/Chest: Effort normal.  Abdominal: Soft. There is no tenderness.  Musculoskeletal: Normal range of motion.  Full ROM of right wrist and elbow without pain. Full passive ROM of right  shoulder. Tenderness to palpation over right deltoid muscle. No signs of deltoid disruption. Muscle spasm noted to right sternocleidomastoid.  Neurological: He is alert and oriented to person, place, and time. No cranial nerve deficit.  Grip strength normal.  Skin: Skin is warm and dry.  Nursing note and vitals reviewed.    ED Treatments / Results  DIAGNOSTIC STUDIES: Oxygen Saturation is 100% on RA, normal by my interpretation.   COORDINATION OF CARE: 7:22 PM- Will prescribe muscle relaxer and Motrin. Pt verbalizes understanding and agrees to plan.  Medications  ibuprofen (ADVIL,MOTRIN)  tablet 800 mg (800 mg Oral Given 12/20/16 1925)  cyclobenzaprine (FLEXERIL) tablet 10 mg (10 mg Oral Given 12/20/16 1925)    Labs (all labs ordered are listed, but only abnormal results are displayed) Labs Reviewed - No data to display  Radiology No results found.  Procedures Procedures (including critical care time)  Medications Ordered in ED Medications  ibuprofen (ADVIL,MOTRIN) tablet 800 mg (800 mg Oral Given 12/20/16 1925)  cyclobenzaprine (FLEXERIL) tablet 10 mg (10 mg Oral Given 12/20/16 1925)     Initial Impression / Assessment and Plan / ED Course  I have reviewed the triage vital signs and the nursing notes.   Clinical Course   I personally performed the services described in this documentation, which was scribed in my presence. The recorded information has been reviewed and is accurate.   Final Clinical Impressions(s) / ED Diagnoses  25 y.o. male with right deltoid pain that radiates to the right SCM area. No signs of deltoid disruption. Stable for d/c. Return precautions given.   Final diagnoses:  Contusion of deltoid region, right, initial encounter    New Prescriptions Discharge Medication List as of 12/20/2016  7:22 PM    START taking these medications   Details  cyclobenzaprine (FLEXERIL) 10 MG tablet Take 1 tablet (10 mg total) by mouth 2 (two) times daily as needed for muscle spasms., Starting Sun 12/20/2016, Print    naproxen (NAPROSYN) 500 MG tablet Take 1 tablet (500 mg total) by mouth 2 (two) times daily., Starting Sun 12/20/2016, Print         Steve M Uzoma Vivona, NP 12/20/16 2223    Steve Pulleyaniel Knott, MD 12/21/16 [phone]

## 2016-12-22 ENCOUNTER — Encounter (HOSPITAL_COMMUNITY): Payer: Self-pay

## 2016-12-22 ENCOUNTER — Emergency Department (HOSPITAL_COMMUNITY)
Admission: EM | Admit: 2016-12-22 | Discharge: 2016-12-22 | Disposition: A | Discharge status: Home / Self Care | Payer: Self-pay | Attending: Emergency Medicine | Admitting: Emergency Medicine

## 2016-12-22 ENCOUNTER — Emergency Department (HOSPITAL_COMMUNITY): Payer: Self-pay

## 2016-12-22 DIAGNOSIS — Y939 Activity, unspecified: Secondary | ICD-10-CM | POA: Insufficient documentation

## 2016-12-22 DIAGNOSIS — W228XXA Striking against or struck by other objects, initial encounter: Secondary | ICD-10-CM | POA: Insufficient documentation

## 2016-12-22 DIAGNOSIS — Y999 Unspecified external cause status: Secondary | ICD-10-CM | POA: Insufficient documentation

## 2016-12-22 DIAGNOSIS — M25511 Pain in right shoulder: Secondary | ICD-10-CM | POA: Insufficient documentation

## 2016-12-22 DIAGNOSIS — F17210 Nicotine dependence, cigarettes, uncomplicated: Secondary | ICD-10-CM | POA: Insufficient documentation

## 2016-12-22 DIAGNOSIS — Y929 Unspecified place or not applicable: Secondary | ICD-10-CM | POA: Insufficient documentation

## 2016-12-22 MED ORDER — CYCLOBENZAPRINE HCL 10 MG PO TABS
5.0000 mg | ORAL_TABLET | Freq: Once | ORAL | Status: AC
  Administered 2016-12-22: 5 mg via ORAL
  Filled 2016-12-22: qty 1

## 2016-12-22 NOTE — Progress Notes (Signed)
Orthopedic Tech Progress Note Patient Details:  Steve PapaQuinton Galgano Aug 23, 1991 [id number]  Ortho Devices Type of Ortho Device: Arm sling Ortho Device/Splint Location: Applied Arm Sling to rigth arm.  Provided training for care and use.  Pt stated he did not want to wear the sling at this time.  (right arm) Ortho Device/Splint Interventions: Application   Alvina ChouWilliams, Brylee Mcgreal C 12/22/2016, 5:48 PM

## 2016-12-22 NOTE — Discharge Instructions (Signed)
Please continue taking naproxen as prescribed. He may also take Tylenol for pain. Please rest, ice, elevate your right arm. He may also apply heat to your right shoulder. Use the sling as needed for comfort. Take the Flexeril for muscle spasms. [facility] He may follow up with the orthopedist if the pain is not improving in the next 6-7 days. Return to the ED if your symptoms worsen.

## 2016-12-22 NOTE — ED Notes (Signed)
Ortho tech at bedside 

## 2016-12-22 NOTE — ED Provider Notes (Signed)
[facility]ER[facility]Y DEPT Provider Note   CSN: [id number] Arrival date & time: 12/22/16  1615  By signing my name below, I, Orpah CobbMaurice Copeland, attest that this documentation has been prepared under the direction and in the presence of Loann QuillKenneth Leapheart, PA-C. Electronically Signed: Orpah CobbMaurice Copeland , ED Scribe. 12/22/16. 5:25 PM.    History   Chief Complaint Chief Complaint  Patient presents with  . Arm Pain    HPI HPI Comments: Steve Branch is a 25 y.o. male who presents to the Emergency Department complaining of mild R shoulder pain onset suddenly x2 days. Pt states that he fell into a shelf landing onto the R lateral upper arm. He states that he has taken Tylenol and Ibuprofen with mild relief. Pt was seen here on 12/24 for this same complaint and prescribed Flexeril but states that he does not have insurance and cannot afford the medication. He reports R shoulder tightness that extends to the trapezius but states that he can move the shoulder without a problem. Pt denies taking muscle relaxers, applying heat to the area. Nothing makes better or worse. Denies any other complaints.   The history is provided by the patient. No language interpreter was used.    No past medical history on file.  Patient Active Problem List   Diagnosis Date Noted  . FIBRILLATION, ATRIAL 03/20/2010    History reviewed. No pertinent surgical history.     Home Medications    Prior to Admission medications   Medication Sig Start Date End Date Taking? Authorizing Provider  clobetasol ointment (TEMOVATE) 0.05 % Apply 1 application topically 2 (two) times daily. To hands only. 07/12/16   Charm RingsErin J Honig, MD  cyclobenzaprine (FLEXERIL) 10 MG tablet Take 1 tablet (10 mg total) by mouth 2 (two) times daily as needed for muscle spasms. 12/20/16   Hope Orlene OchM Neese, NP  diclofenac (VOLTAREN) 50 MG EC tablet Take 1 tablet (50 mg total) by mouth 2 (two) times daily. 07/30/16   Elson AreasLeslie K Sofia, PA-C  ibuprofen  (ADVIL,MOTRIN) 200 MG tablet Take 400 mg by mouth every 6 (six) hours as needed (for pain/swelling).    Historical Provider, MD  meloxicam (MOBIC) 15 MG tablet Take 1 tablet (15 mg total) by mouth daily. 07/11/14   Kaitlyn Szekalski, PA-C  naproxen (NAPROSYN) 500 MG tablet Take 1 tablet (500 mg total) by mouth 2 (two) times daily. 12/20/16   Hope Orlene OchM Neese, NP    Family History No family history on file.  Social History Social History  Substance Use Topics  . Smoking status: Current Every Day Smoker    Packs/day: 0.50    Types: Cigarettes  . Smokeless tobacco: Never Used  . Alcohol use Yes     Comment: ocaasional.     Allergies   Patient has no known allergies.   Review of Systems Review of Systems  Constitutional: Negative for chills and fever.  HENT: Negative for ear pain and sore throat.   Eyes: Negative for pain and visual disturbance.  Respiratory: Negative for cough and shortness of breath.   Cardiovascular: Negative for chest pain and palpitations.  Gastrointestinal: Negative for abdominal pain and vomiting.  Genitourinary: Negative for dysuria and hematuria.  Musculoskeletal: Positive for myalgias (R lateral upper arm). Negative for arthralgias and back pain.  Skin: Negative for color change and rash.  Neurological: Negative for seizures and syncope.  All other systems reviewed and are negative.    Physical Exam Updated Vital Signs BP 127/77 (BP Location: Right Arm)  Pulse 90   Temp 98.5 F (36.9 C) (Oral)   Resp 16   SpO2 98%   Physical Exam  Constitutional: He appears well-developed and well-nourished.  HENT:  Head: Normocephalic and atraumatic.  Eyes: Conjunctivae are normal. Pupils are equal, round, and reactive to light.  Neck: Normal range of motion. Neck supple.  Cardiovascular: Normal rate and regular rhythm.   No murmur heard. Pulmonary/Chest: Effort normal and breath sounds normal. No respiratory distress.  Abdominal: Soft. There is no  tenderness.  Musculoskeletal: He exhibits no edema.       Right shoulder: He exhibits bony tenderness, pain (minimal) and spasm. He exhibits normal range of motion, no tenderness, no swelling, no effusion, no crepitus, no deformity, no laceration, normal pulse and normal strength.  Strength 5 out of 5 in upper extremities. Sensation intact. Cap refill normal. Radial pulses are 2+ bilaterally. Negative Hawkins test. Negative Neer's test. Tense musculature noted in the right upper trapezius.  Neurological: He is alert.  Skin: Skin is warm and dry.  Psychiatric: He has a normal mood and affect.  Nursing note and vitals reviewed.    ED Treatments / Results   DIAGNOSTIC STUDIES: Oxygen Saturation is 98% on RA, normal by my interpretation.   COORDINATION OF CARE: 5:26 PM-Discussed next steps with pt. Pt verbalized understanding and is agreeable with the plan.    Labs (all labs ordered are listed, but only abnormal results are displayed) Labs Reviewed - No data to display  EKG  EKG Interpretation None       Radiology Dg Shoulder Right  Result Date: 12/22/2016 CLINICAL DATA:  Right shoulder injury 2 days ago, pain superiorly. EXAM: RIGHT SHOULDER - 2+ VIEW COMPARISON:  None. FINDINGS: There is no evidence of fracture or dislocation. There is no evidence of arthropathy or other focal bone abnormality. Soft tissues are unremarkable. IMPRESSION: Negative. Electronically Signed   By: Gaylyn Rong M.D.   On: 12/22/2016 17:24    Procedures Procedures (including critical care time)  Medications Ordered in ED Medications  cyclobenzaprine (FLEXERIL) tablet 5 mg (5 mg Oral Given 12/22/16 1749)     Initial Impression / Assessment and Plan / ED Course  I have reviewed the triage vital signs and the nursing notes.  Pertinent labs & imaging results that were available during my care of the patient were reviewed by me and considered in my medical decision making (see chart for  details).  Clinical Course   Patient presents with right shoulder pain. Seen in the ED 2 days ago for same. Patient was requesting x-ray today. Patient X-Ray negative for obvious fracture or dislocation. Patient is neurovascularly intact with full range of motion of the right arm. Pain managed in ED. Pt advised to follow up with orthopedics if symptoms persist for possibility of missed fracture diagnosis. Patient given brace while in ED, conservative therapy recommended and discussed. Patient will be dc home & is agreeable with above plan. Discussed with patient that Flexeril is on the $4 list at [facility]. He agrees to go by and get the prescription filled. Still has his prescription in hand.  Final Clinical Impressions(s) / ED Diagnoses   Final diagnoses:  Acute pain of right shoulder    New Prescriptions Discharge Medication List as of 12/22/2016  5:36 PM     I personally performed the services described in this documentation, which was scribed in my presence. The recorded information has been reviewed and is accurate.     Rise Mu,  PA-C 12/22/16 1807    Cathren LaineKevin Steinl, MD 12/22/16 [phone]

## 2016-12-22 NOTE — ED Triage Notes (Signed)
Pt presents for R arm injury 2 days ago. Pt. States he hit his R arm on shelf, denies open wound to arm. Pt. Reports pain from mid upper arm to shoulder. Pt seen two days ago for same, requesting xray today.

## 2016-12-22 NOTE — ED Notes (Signed)
Patient taken to xray.

## 2017-07-28 ENCOUNTER — Emergency Department (HOSPITAL_COMMUNITY): Payer: Self-pay

## 2017-07-28 ENCOUNTER — Emergency Department (HOSPITAL_COMMUNITY)
Admission: EM | Admit: 2017-07-28 | Discharge: 2017-07-29 | Disposition: A | Discharge status: Home / Self Care | Payer: Self-pay | Attending: Emergency Medicine | Admitting: Emergency Medicine

## 2017-07-28 ENCOUNTER — Encounter (HOSPITAL_COMMUNITY): Payer: Self-pay | Admitting: Emergency Medicine

## 2017-07-28 DIAGNOSIS — R51 Headache: Secondary | ICD-10-CM | POA: Insufficient documentation

## 2017-07-28 DIAGNOSIS — F17210 Nicotine dependence, cigarettes, uncomplicated: Secondary | ICD-10-CM | POA: Insufficient documentation

## 2017-07-28 DIAGNOSIS — M549 Dorsalgia, unspecified: Secondary | ICD-10-CM | POA: Insufficient documentation

## 2017-07-28 DIAGNOSIS — J189 Pneumonia, unspecified organism: Secondary | ICD-10-CM

## 2017-07-28 DIAGNOSIS — J181 Lobar pneumonia, unspecified organism: Secondary | ICD-10-CM | POA: Insufficient documentation

## 2017-07-28 HISTORY — DX: Migraine, unspecified, not intractable, without status migrainosus: G43.909

## 2017-07-28 LAB — URINALYSIS, ROUTINE W REFLEX MICROSCOPIC
Bilirubin Urine: NEGATIVE
Glucose, UA: NEGATIVE mg/dL
Ketones, ur: 20 mg/dL — AB
Nitrite: NEGATIVE
PH: 5 (ref 5.0–8.0)
Protein, ur: 30 mg/dL — AB
SPECIFIC GRAVITY, URINE: 1.018 (ref 1.005–1.030)

## 2017-07-28 LAB — COMPREHENSIVE METABOLIC PANEL
ALT: 15 U/L — ABNORMAL LOW (ref 17–63)
AST: 22 U/L (ref 15–41)
Albumin: 3.7 g/dL (ref 3.5–5.0)
Alkaline Phosphatase: 62 U/L (ref 38–126)
Anion gap: 12 (ref 5–15)
CHLORIDE: 101 mmol/L (ref 101–111)
CO2: 22 mmol/L (ref 22–32)
Calcium: 9 mg/dL (ref 8.9–10.3)
Creatinine, Ser: 0.78 mg/dL (ref 0.61–1.24)
GFR calc Af Amer: >60 mL/min (ref >60)
GFR calc non Af Amer: >60 mL/min (ref >60)
Glucose, Bld: 104 mg/dL — ABNORMAL HIGH (ref 65–99)
POTASSIUM: 3.7 mmol/L (ref 3.5–5.1)
Sodium: 135 mmol/L (ref 135–145)
Total Bilirubin: 0.8 mg/dL (ref 0.3–1.2)
Total Protein: 8.2 g/dL — ABNORMAL HIGH (ref 6.5–8.1)

## 2017-07-28 LAB — I-STAT CG4 LACTIC ACID, ED
Lactic Acid, Venous: 2.03 mmol/L (ref 0.5–1.9)
Lactic Acid, Venous: 1.45 mmol/L (ref 0.5–1.9)

## 2017-07-28 LAB — CBC WITH DIFFERENTIAL/PLATELET
BASOS ABS: 0 10*3/uL (ref 0.0–0.1)
BASOS PCT: 0 %
EOS ABS: 0 10*3/uL (ref 0.0–0.7)
EOS PCT: 0 %
HCT: 47.4 % (ref 39.0–52.0)
Hemoglobin: 15.7 g/dL (ref 13.0–17.0)
Lymphocytes Relative: 18 %
Lymphs Abs: 1.5 10*3/uL (ref 0.7–4.0)
MCH: 31.8 pg (ref 26.0–34.0)
MCHC: 33.1 g/dL (ref 30.0–36.0)
MCV: 96.1 fL (ref 78.0–100.0)
Monocytes Absolute: 0.8 10*3/uL (ref 0.1–1.0)
Monocytes Relative: 9 %
Neutro Abs: 6.2 10*3/uL (ref 1.7–7.7)
Neutrophils Relative %: 73 %
Platelets: 225 10*3/uL (ref 150–400)
RBC: 4.93 MIL/uL (ref 4.22–5.81)
RDW: 14.9 % (ref 11.5–15.5)
WBC: 8.5 10*3/uL (ref 4.0–10.5)

## 2017-07-28 MED ORDER — DEXTROSE 5 % IV SOLN
1.0000 g | Freq: Once | INTRAVENOUS | Status: AC
  Administered 2017-07-28: 1 g via INTRAVENOUS
  Filled 2017-07-28: qty 10

## 2017-07-28 MED ORDER — AZITHROMYCIN 250 MG PO TABS
500.0000 mg | ORAL_TABLET | Freq: Once | ORAL | Status: AC
  Administered 2017-07-28: 500 mg via ORAL
  Filled 2017-07-28: qty 2

## 2017-07-28 MED ORDER — SODIUM CHLORIDE 0.9 % IV SOLN
Freq: Once | INTRAVENOUS | Status: AC
  Administered 2017-07-28: 22:00:00 via INTRAVENOUS

## 2017-07-28 NOTE — ED Notes (Signed)
Pt transported to xray 

## 2017-07-28 NOTE — ED Triage Notes (Addendum)
Pt with headache starting yesterday that has gotten worse. Pt endorses light sensitivity, denies dizziness or vision changes. Tylenol PTA 1300. No vomiting. Hx of migraine. Pt also endorses L side,posterior back pain that hurts with deep inspiration. Denies fall or injury.

## 2017-07-28 NOTE — ED Provider Notes (Signed)
[facility] Provider Note   CSN: [id number] Arrival date & time: 07/28/17  1750 By signing my name below, I, Steve Branch, attest that this documentation has been prepared under the direction and in the presence of [facility] Center Ii LPope Neese, [place]. Electronically Signed: Bridgette HabermannMaria Branch, ED Scribe. 07/28/17. 7:02 PM.  History   Chief Complaint Chief Complaint  Patient presents with  . Headache  . Back Pain    HPI The history is provided by the patient. No language interpreter was used.   HPI Comments: Steve Branch is a 26 y.o. male with h/o migraines, who presents to the Emergency Department complaining of left-sided rib pain beginning two days ago. Pt denies any recent injury or trauma. Pain is worse with deep inspiration and palpation.  Pt is also complaining of right-sided headache beginning yesterday with associated photophobia. Pt states he took a Tylenol with complete relief to headache. Has h/o migraines and notes this feels like his regular migraine. Headache is resolved at this time.  Pt denies fever, chills, nausea, vomiting, visual disturbances, or any other associated symptoms.   Past Medical History:  Diagnosis Date  . Migraine     Patient Active Problem List   Diagnosis Date Noted  . FIBRILLATION, ATRIAL 03/20/2010    History reviewed. No pertinent surgical history.     Home Medications    Prior to Admission medications   Medication Sig Start Date End Date Taking? Authorizing Provider  amoxicillin-clavulanate (AUGMENTIN XR) 1000-62.5 MG 12 hr tablet Take 2 tablets by mouth 2 (two) times daily. 07/28/17   Janne NapoleonNeese, Hope M, NP  azithromycin [facility](ZITHROMAX) 250 MG tablet Take one tablet PO daily 07/29/17   Kerrie BuffaloNeese, Hope M, NP  clobetasol ointment (TEMOVATE) 0.05 % Apply 1 application topically 2 (two) times daily. To hands only. 07/12/16   Charm RingsHonig, Erin J, MD  cyclobenzaprine (FLEXERIL) 10 MG tablet Take 1 tablet (10 mg total) by mouth 2 (two) times daily as needed for muscle spasms.  12/20/16   Janne NapoleonNeese, Hope M, NP  diclofenac (VOLTAREN) 50 MG EC tablet Take 1 tablet (50 mg total) by mouth 2 (two) times daily. 07/30/16   Elson AreasSofia, Leslie K, PA-C  ibuprofen (ADVIL,MOTRIN) 200 MG tablet Take 400 mg by mouth every 6 (six) hours as needed (for pain/swelling).    [provider]  meloxicam (MOBIC) 15 MG tablet Take 1 tablet (15 mg total) by mouth daily. 07/11/14   Szekalski, Kaitlyn, PA-C  naproxen (NAPROSYN) 500 MG tablet Take 1 tablet (500 mg total) by mouth 2 (two) times daily. 12/20/16   Janne NapoleonNeese, Hope M, NP    Family History No family history on file.  Social History Social History  Substance Use Topics  . Smoking status: Current Every Day Smoker    Packs/day: 0.50    Types: Cigarettes  . Smokeless tobacco: Never Used  . Alcohol use Yes     Comment: ocaasional.     Allergies   Patient has no known allergies.   Review of Systems Review of Systems  Constitutional: Negative for chills and fever.  Gastrointestinal: Negative for nausea and vomiting.  Musculoskeletal: Positive for myalgias.   Physical Exam Updated Vital Signs BP 128/73   Pulse 100   Temp 98.9 F (37.2 C) (Oral)   Resp 17   SpO2 97%   Physical Exam  Constitutional: He is oriented to person, place, and time. He appears well-developed and well-nourished. No distress.  HENT:  Head: Normocephalic and atraumatic.  Right Ear: Tympanic membrane normal.  Left Ear:  Tympanic membrane normal.  Nose: Nose normal.  Mouth/Throat: Uvula is midline, oropharynx is clear and moist and mucous membranes are normal.  Eyes: Pupils are equal, round, and reactive to light. Conjunctivae and EOM are normal.  Neck: Normal range of motion and full passive range of motion without pain. Neck supple.  No meningeal signs.  Cardiovascular: Normal rate and regular rhythm.   Pulmonary/Chest: Effort normal and breath sounds normal.  Abdominal: Soft. There is no tenderness.  Musculoskeletal: Normal range of motion. He  exhibits no edema.  Grips equal. Radial pulses 2+.  Lymphadenopathy:    He has no cervical adenopathy.  Neurological: He is alert and oriented to person, place, and time. He has normal strength. No cranial nerve deficit or sensory deficit. He displays a negative Romberg sign. Gait normal.  Reflex Scores:      Bicep reflexes are 2+ on the right side and 2+ on the left side.      Brachioradialis reflexes are 2+ on the right side and 2+ on the left side.      Patellar reflexes are 2+ on the right side and 2+ on the left side. Reflexes symmetrical and normal. Steady gait, no foot drag. Stands on one foot without difficulty. Rapid alternating movements without difficulty.  Skin: Skin is warm and dry.  Psychiatric: He has a normal mood and affect. His behavior is normal.  Nursing note and vitals reviewed.  ED Treatments / Results  DIAGNOSTIC STUDIES: Oxygen Saturation is 97% on RA, adequate by my interpretation.   COORDINATION OF CARE: 7:02 PM-Discussed next steps with pt. Pt verbalized understanding and is agreeable with the plan.   Labs (all labs ordered are listed, but only abnormal results are displayed) Labs Reviewed  URINALYSIS, ROUTINE W REFLEX MICROSCOPIC - Abnormal; Notable for the following:       Result Value   APPearance HAZY (*)    Hgb urine dipstick MODERATE (*)    Ketones, ur 20 (*)    Protein, ur 30 (*)    Leukocytes, UA MODERATE (*)    Bacteria, UA RARE (*)    Squamous Epithelial / LPF 0-5 (*)    All other components within normal limits  COMPREHENSIVE METABOLIC PANEL - Abnormal; Notable for the following:    Glucose, Bld 104 (*)    BUN <5 (*)    Total Protein 8.2 (*)    ALT 15 (*)    All other components within normal limits  I-STAT CG4 LACTIC ACID, ED - Abnormal; Notable for the following:    Lactic Acid, Venous 2.03 (*)    All other components within normal limits  URINE CULTURE  CULTURE, BLOOD (ROUTINE X 2)  CULTURE, BLOOD (ROUTINE X 2)  CBC WITH  DIFFERENTIAL/PLATELET  RPR  HIV ANTIBODY (ROUTINE TESTING)  I-STAT CG4 LACTIC ACID, ED  GC[facility]CHLAMYDIA PRO[facility] AMP ([place]    Radiology Dg Ribs Unilateral W/chest Left  Result Date: 07/28/2017 CLINICAL DATA:  Left posterior rib pain.  No known injury.  Smoker. EXAM: LEFT RIBS AND CHEST - 3+ VIEW COMPARISON:  11/12/2012. FINDINGS: Interval somewhat patchy, oval opacity in the lateral aspect of the left lower lung zone, measuring 4.0 x 2.3 cm on the frontal view. The remainder of the lungs are clear with minimal diffuse peribronchial thickening. Normal sized heart. Unremarkable bones with no rib abnormality seen. IMPRESSION: Interval 4.0 x 2.3 cm area of somewhat patchy, oval opacity in the lateral aspect of the left lower lung zone. This  is concerning for a small focus of infection. A mass is less likely. If the patient has clinical evidence of infection, followup PA and lateral chest X-ray would be recommended in 3-4 weeks following trial of antibiotic therapy to ensure resolution and exclude underlying malignancy. If the patient does not have any clinical findings of infection, further evaluation with a chest CT with contrast at this time would be recommended. Electronically Signed   By: Beckie Salts M.D.   On: 07/28/2017 19:56   Ct Chest Wo Contrast  Result Date: 07/28/2017 CLINICAL DATA:  Left-sided rib pain for 2 days. EXAM: CT CHEST WITHOUT CONTRAST TECHNIQUE: Multidetector CT imaging of the chest was performed following the standard protocol without IV contrast. COMPARISON:  Chest radiograph 07/28/2017 FINDINGS: Cardiovascular: No significant vascular findings. Normal heart size. No pericardial effusion. Mediastinum/Nodes: No enlarged mediastinal or axillary lymph nodes. Thyroid gland, trachea, and esophagus demonstrate no significant findings. Lungs/Pleura: There is a focal area of consolidation within the lateral left lower lobe. The lungs are otherwise clear. Upper Abdomen: No  acute abnormality. Musculoskeletal: No chest wall mass or suspicious bone lesions identified. IMPRESSION: Peripheral left lower lobe consolidation, most consistent with pneumonia. Electronically Signed   By: Deatra Robinson M.D.   On: 07/28/2017 20:57    Procedures Procedures (including critical care time)  Medications Ordered in ED Medications  0.9 %  sodium chloride infusion ( Intravenous Stopped 07/28/17 2317)  cefTRIAXone (ROCEPHIN) 1 g in dextrose 5 % 50 mL IVPB (0 g Intravenous Stopped 07/28/17 2242)  azithromycin (ZITHROMAX) tablet 500 mg (500 mg Oral Given 07/28/17 2212)     Initial Impression / Assessment and Plan / ED Course  I have reviewed the triage vital signs and the nursing notes.  Final Clinical Impressions(s) / ED Diagnoses  26 y.o. male with complaint of left side back pain stable for d/c without fever and does not appear toxic. CT shows LLL pneumonia. Patient treated with IV fluids and IV antibiotics and PO zithromax while in the ED. Discussed importance of f/u in the next 2 days or return here for worsening symptoms.  HIV and syphilis pending. Urine culture pending.   I discussed this case with Dr. Donnald Garre and she agrees with plan.  Patient to call the [facility] for follow up. He will return here as needed.  Final diagnoses:  Pneumonia of left lower lobe due to infectious organism [facility])    New Prescriptions New Prescriptions   AMOXICILLIN-CLAVULANATE (AUGMENTIN XR) 1000-62.5 MG 12 HR TABLET    Take 2 tablets by mouth 2 (two) times daily.   AZITHROMYCIN (ZITHROMAX) 250 MG TABLET    Take one tablet PO daily  I personally performed the services described in this documentation, which was scribed in my presence. The recorded information has been reviewed and is accurate.    Kerrie Buffalo [place], [place] 07/29/17 Orpah Cobb    Arby Barrette, MD 07/29/17 2351

## 2017-07-28 NOTE — Discharge Instructions (Signed)
It is very important that you follow up to be sure your pneumonia is improving. Call the internal medicine clinic for a follow up appointment. Return here for worsening symptoms.

## 2017-07-29 LAB — HIV ANTIBODY (ROUTINE TESTING W REFLEX): HIV Screen 4th Generation wRfx: NONREACTIVE

## 2017-07-29 LAB — RPR: RPR Ser Ql: NONREACTIVE

## 2017-07-29 MED ORDER — AZITHROMYCIN 250 MG PO TABS: ORAL_TABLET | ORAL | 0 refills | Status: DC

## 2017-07-29 MED ORDER — AMOXICILLIN-POT CLAVULANATE ER 1000-62.5 MG PO TB12: 2.0000 | ORAL_TABLET | Freq: Two times a day (BID) | ORAL | 0 refills | Status: DC

## 2017-07-30 LAB — URINE CULTURE

## 2017-07-30 LAB — GC/CHLAMYDIA PROBE AMP (~~LOC~~) NOT AT ARMC
CHLAMYDIA, DNA PROBE: NEGATIVE
NEISSERIA GONORRHEA: NEGATIVE

## 2017-07-31 ENCOUNTER — Telehealth: Payer: Self-pay

## 2017-07-31 NOTE — Telephone Encounter (Signed)
No treatment for UC in ED 07/29/17 per Lysle Pearlachel Rumbarger Pharm D

## 2017-08-02 ENCOUNTER — Emergency Department (HOSPITAL_COMMUNITY)
Admission: EM | Admit: 2017-08-02 | Discharge: 2017-08-02 | Disposition: A | Discharge status: Home / Self Care | Payer: No Typology Code available for payment source | Attending: Emergency Medicine | Admitting: Emergency Medicine

## 2017-08-02 ENCOUNTER — Encounter (HOSPITAL_COMMUNITY): Payer: Self-pay

## 2017-08-02 DIAGNOSIS — Y998 Other external cause status: Secondary | ICD-10-CM | POA: Insufficient documentation

## 2017-08-02 DIAGNOSIS — Y92410 Unspecified street and highway as the place of occurrence of the external cause: Secondary | ICD-10-CM | POA: Diagnosis not present

## 2017-08-02 DIAGNOSIS — S3992XA Unspecified injury of lower back, initial encounter: Secondary | ICD-10-CM | POA: Diagnosis present

## 2017-08-02 DIAGNOSIS — Y9389 Activity, other specified: Secondary | ICD-10-CM | POA: Diagnosis not present

## 2017-08-02 DIAGNOSIS — S39012A Strain of muscle, fascia and tendon of lower back, initial encounter: Secondary | ICD-10-CM | POA: Diagnosis not present

## 2017-08-02 DIAGNOSIS — F17210 Nicotine dependence, cigarettes, uncomplicated: Secondary | ICD-10-CM | POA: Insufficient documentation

## 2017-08-02 DIAGNOSIS — S161XXA Strain of muscle, fascia and tendon at neck level, initial encounter: Secondary | ICD-10-CM | POA: Diagnosis not present

## 2017-08-02 DIAGNOSIS — Z79899 Other long term (current) drug therapy: Secondary | ICD-10-CM | POA: Insufficient documentation

## 2017-08-02 LAB — CULTURE, BLOOD (ROUTINE X 2)
CULTURE: NO GROWTH
Culture: NO GROWTH
SPECIAL REQUESTS: ADEQUATE
Special Requests: ADEQUATE

## 2017-08-02 MED ORDER — CYCLOBENZAPRINE HCL 10 MG PO TABS: 10.0000 mg | ORAL_TABLET | Freq: Every day | ORAL | 0 refills | Status: DC
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
## 2017-08-02 NOTE — ED Notes (Signed)
Pt A/Ox4, ambulatory. Pt verbalizes understanding of d/c instructions, meds, and follow up care. Pt has all belongings upon departure.
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
## 2017-08-02 NOTE — Discharge Instructions (Signed)
Alternate 600 mg of ibuprofen and [phone] mg of Tylenol every 3 hours as needed for pain. Do not exceed 4000 mg of Tylenol daily.Take flexeril for muscle relaxation but do not drive, drink alcohol, or operate machinery with flexeril use. Ice to areas of soreness for the next few days and then may move to heat. Expect to be sore for the next few day and follow up with primary care physician for recheck of ongoing symptoms but return to ER for emergent changing or worsening of symptoms.

## 2017-09-07 ENCOUNTER — Emergency Department (HOSPITAL_COMMUNITY)
Admission: EM | Admit: 2017-09-07 | Discharge: 2017-09-07 | Disposition: A | Discharge status: Home / Self Care | Payer: Self-pay | Attending: Emergency Medicine | Admitting: Emergency Medicine

## 2017-09-07 ENCOUNTER — Encounter (HOSPITAL_COMMUNITY): Payer: Self-pay | Admitting: *Deleted

## 2017-09-07 ENCOUNTER — Emergency Department (HOSPITAL_COMMUNITY): Payer: Self-pay

## 2017-09-07 DIAGNOSIS — Z79899 Other long term (current) drug therapy: Secondary | ICD-10-CM | POA: Insufficient documentation

## 2017-09-07 DIAGNOSIS — J189 Pneumonia, unspecified organism: Secondary | ICD-10-CM

## 2017-09-07 DIAGNOSIS — J181 Lobar pneumonia, unspecified organism: Secondary | ICD-10-CM | POA: Insufficient documentation

## 2017-09-07 DIAGNOSIS — F17210 Nicotine dependence, cigarettes, uncomplicated: Secondary | ICD-10-CM | POA: Insufficient documentation

## 2017-09-07 MED ORDER — IBUPROFEN 800 MG PO TABS
800.0000 mg | ORAL_TABLET | Freq: Once | ORAL | Status: AC
  Administered 2017-09-07: 800 mg via ORAL
  Filled 2017-09-07: qty 1

## 2017-09-07 MED ORDER — DOXYCYCLINE HYCLATE 100 MG PO CAPS: 100.0000 mg | ORAL_CAPSULE | Freq: Two times a day (BID) | ORAL | 0 refills | Status: DC

## 2017-09-07 MED ORDER — DOXYCYCLINE HYCLATE 100 MG PO TABS
100.0000 mg | ORAL_TABLET | Freq: Once | ORAL | Status: AC
  Administered 2017-09-07: 100 mg via ORAL
  Filled 2017-09-07: qty 1

## 2017-09-07 NOTE — Discharge Instructions (Signed)
It is very important you take the entire course of antibiotics prescribed. Return to the ED if you develop new or concerning symptoms, or failure getting worse despite antibiotics. [facility] or call number provided to get established with a primary doctor for follow-up.

## 2017-09-07 NOTE — ED Provider Notes (Signed)
[facility]-EM[facility]Y DEPT Provider Note   CSN: [id number] Arrival date & time: 09/07/17  1205     History   Chief Complaint Chief Complaint  Patient presents with  . Shortness of Breath    HPI  Steve Branch is a 26 y.o. Male who presents to the ED complaining of right-sided pleuritic chest pain, shortness of breath and productive cough for the past 3-4 days. He reports pain is only present when he takes a deep breath, this has been progressively worsening over the past few days. Patient denies fever or chills, rhinorrhea, congestion. Patient was seen in the ED on 8/1 and diagnosed with a left lower lobe pneumonia, he was given 1 dose of IV Rocephin in the ED and discharged with by mouth Zithromax. Patient reports he did not fill this prescription, he states he took some antibiotics that his grandmother had, he is unsure the name, and his symptoms improved. He did not follow-up with anyone regarding this pneumonia. Patient denies sick contacts.       Past Medical History:  Diagnosis Date  . Migraine     Patient Active Problem List   Diagnosis Date Noted  . FIBRILLATION, ATRIAL 03/20/2010    History reviewed. No pertinent surgical history.     Home Medications    Prior to Admission medications   Medication Sig Start Date End Date Taking? Authorizing Provider  amoxicillin-clavulanate (AUGMENTIN XR) 1000-62.5 MG 12 hr tablet Take 2 tablets by mouth 2 (two) times daily. 07/28/17   Janne Napoleon, NP  azithromycin [facility]) 250 MG tablet Take one tablet PO daily 07/29/17   Kerrie Buffalo M, NP  clobetasol ointment (TEMOVATE) 0.05 % Apply 1 application topically 2 (two) times daily. To hands only. 07/12/16   Charm Rings, MD  cyclobenzaprine (FLEXERIL) 10 MG tablet Take 1 tablet (10 mg total) by mouth at bedtime. 08/02/17   Luevenia Maxin, Mina A, PA-C  diclofenac (VOLTAREN) 50 MG EC tablet Take 1 tablet (50 mg total) by mouth 2 (two) times daily. 07/30/16   Elson Areas, PA-C  ibuprofen  (ADVIL,MOTRIN) 200 MG tablet Take 400 mg by mouth every 6 (six) hours as needed (for pain/swelling).    [provider]  meloxicam (MOBIC) 15 MG tablet Take 1 tablet (15 mg total) by mouth daily. 07/11/14   Szekalski, Kaitlyn, PA-C  naproxen (NAPROSYN) 500 MG tablet Take 1 tablet (500 mg total) by mouth 2 (two) times daily. 12/20/16   Janne Napoleon, NP    Family History No family history on file.  Social History Social History  Substance Use Topics  . Smoking status: Current Every Day Smoker    Packs/day: 0.50    Types: Cigarettes  . Smokeless tobacco: Never Used  . Alcohol use Yes     Comment: ocaasional.     Allergies   Patient has no known allergies.   Review of Systems Review of Systems  Constitutional: Negative for chills and fever.  HENT: Negative for congestion, ear pain, rhinorrhea and sore throat.   Eyes: Negative for photophobia and visual disturbance.  Respiratory: Positive for cough and shortness of breath. Negative for wheezing.   Cardiovascular: Positive for chest pain. Negative for palpitations.  Gastrointestinal: Negative for abdominal pain, nausea and vomiting.  Genitourinary: Negative for difficulty urinating and dysuria.  Musculoskeletal: Negative for myalgias.  Skin: Negative for pallor and rash.  Neurological: Negative for dizziness, weakness, numbness and headaches.     Physical Exam Updated Vital Signs BP 128/65   Pulse  78   Temp 99.1 F (37.3 C) (Oral)   Resp 18   Ht  (1.778 m)   Wt 106.6 kg (235 lb)   SpO2 100%   BMI 33.72 kg/m   Physical Exam  Constitutional: He appears well-developed and well-nourished. No distress.  HENT:  Head: Normocephalic and atraumatic.  Eyes: Pupils are equal, round, and reactive to light. Right eye exhibits no discharge. Left eye exhibits no discharge.  Cardiovascular: Normal rate, regular rhythm, normal heart sounds and intact distal pulses.   Pulmonary/Chest: Effort normal and breath sounds  normal. No respiratory distress. He has no wheezes. He has no rales.  No evidence of respiratory distress, no hypoxia or tachypnea, good air movement bilaterally, mild rales heard on the right  Abdominal: Soft. Bowel sounds are normal. There is no tenderness. There is no guarding.  Musculoskeletal: He exhibits no edema or deformity.  Neurological: He is alert. Coordination normal.  Skin: Skin is warm and dry. Capillary refill takes less than 2 seconds. He is not diaphoretic.  Psychiatric: He has a normal mood and affect. His behavior is normal.  Nursing note and vitals reviewed.    ED Treatments / Results  Labs (all labs ordered are listed, but only abnormal results are displayed) Labs Reviewed - No data to display  EKG  EKG Interpretation None       Radiology Dg Chest 2 View  Result Date: 09/07/2017 CLINICAL DATA:  Pneumonia. EXAM: CHEST  2 VIEW COMPARISON:  07/28/2017 FINDINGS: Normal heart size. No pleural effusion or edema. Interval decrease and previously noted left lower lobe airspace opacities. There are new airspace densities identified within the right midlung and right base. IMPRESSION: 1. Interval development of right midlung and right base airspace opacities worrisome for pneumonia. 2. Partial clearing of left lower lobe pneumonia. Electronically Signed   By: Signa Kell M.D.   On: 09/07/2017 13:08    Procedures Procedures (including critical care time)  Medications Ordered in ED Medications  doxycycline (VIBRA-TABS) tablet 100 mg (100 mg Oral Given 09/07/17 1633)  ibuprofen (ADVIL,MOTRIN) tablet 800 mg (800 mg Oral Given 09/07/17 1633)     Initial Impression / Assessment and Plan / ED Course  I have reviewed the triage vital signs and the nursing notes.  Pertinent labs & imaging results that were available during my care of the patient were reviewed by me and considered in my medical decision making (see chart for details).  Patient presents with pleuritic  right-sided chest pain, shortness of breath, productive cough. Patient nontoxic appearing , Afebrile and vitals normal on initial evaluation. CXR consistent with right middle lobe pneumonia, left lower lobe pneumonia from ED visit on 8/1 appears to be clearing. Patient will be discharged home with prescription for doxycycline, one dose given here in the ED. Patient expressed concern over cost of medication, good Rx coupon provided. Because of patient's previous noncompliance, discussed the importance of filling and taking entire course of antibiotics with patient, discussed risk of not taking antibiotics such as worsening infection, sepsis or death, patient expressed understanding. Patient reports he does not have a PCP, provided resources for establishing care as well as follow-[facility]. Expressed the importance of following up given multiple pneumonias, HIV and RPR were obtained during previous visit and were both negative and again patient is well-appearing and does not warrant admission at this time. Return precautions provided and patient expressed understanding.  Patient discussed with Dr. Criss Alvine, who agrees with plan.  Final Clinical Impressions(s) / ED Diagnoses   Final diagnoses:  Community acquired pneumonia of right middle lobe of lung (HCC)    New Prescriptions Discharge Medication List as of 09/07/2017  4:31 PM    START taking these medications   Details  doxycycline (VIBRAMYCIN) 100 MG capsule Take 1 capsule (100 mg total) by mouth 2 (two) times daily. One po bid x 7 days, Starting Tue 09/07/2017, Print         Dartha LodgeFord, Robbin Loughmiller N, [place]PA-C 09/07/17 2031    Pricilla LovelessGoldston, Scott, MD 09/10/17 1240

## 2017-09-07 NOTE — ED Triage Notes (Signed)
Pt states recent pna a couple weeks ago and did not get script of anbx.  Now back with pain on deep breath, no fever, no distress.

## 2018-02-09 IMAGING — CR DG CERVICAL SPINE 2 OR 3 VIEWS
5 series · 5 of 5 positions shown · non-contrast
Comparison: None.

CLINICAL DATA: Slip and fall 2 days ago with neck pain, initial
encounter

EXAM:
CERVICAL SPINE - 3 VIEW

[w cervical spine lat]
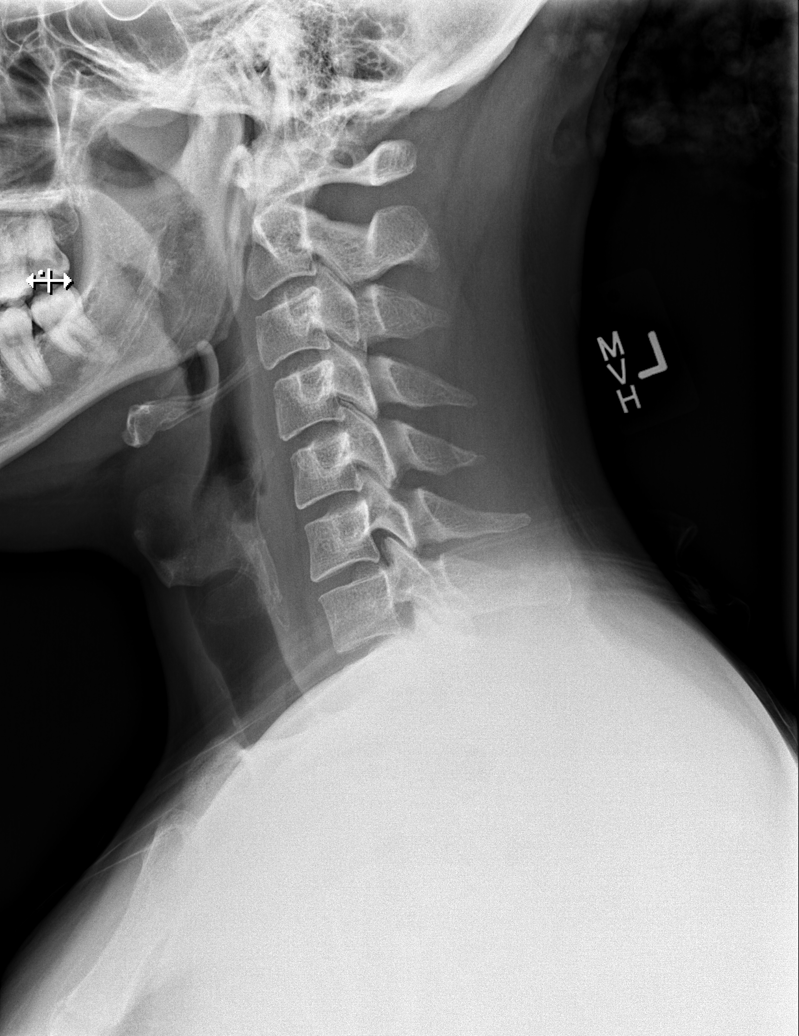

[w cervical swimmers]
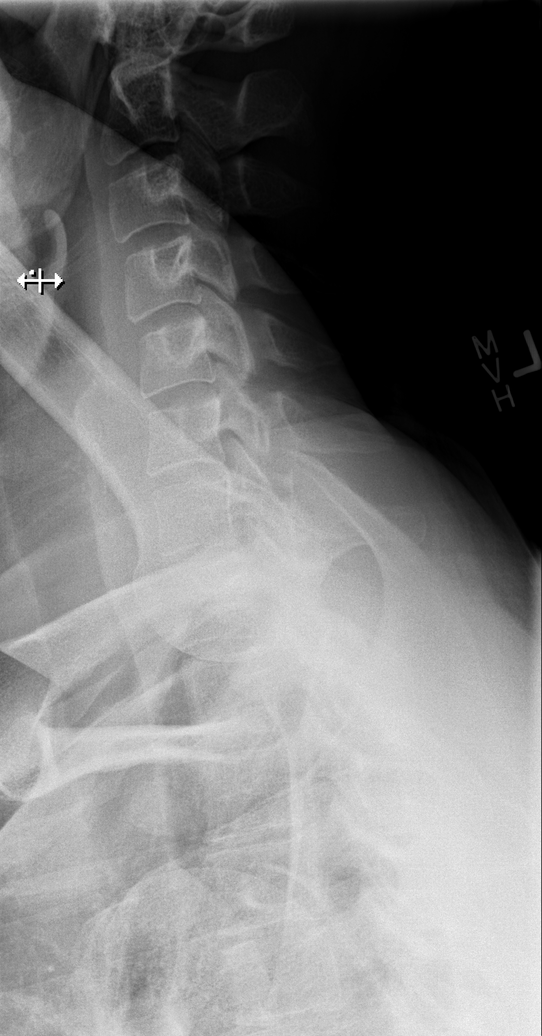

[w cervical spine ap]
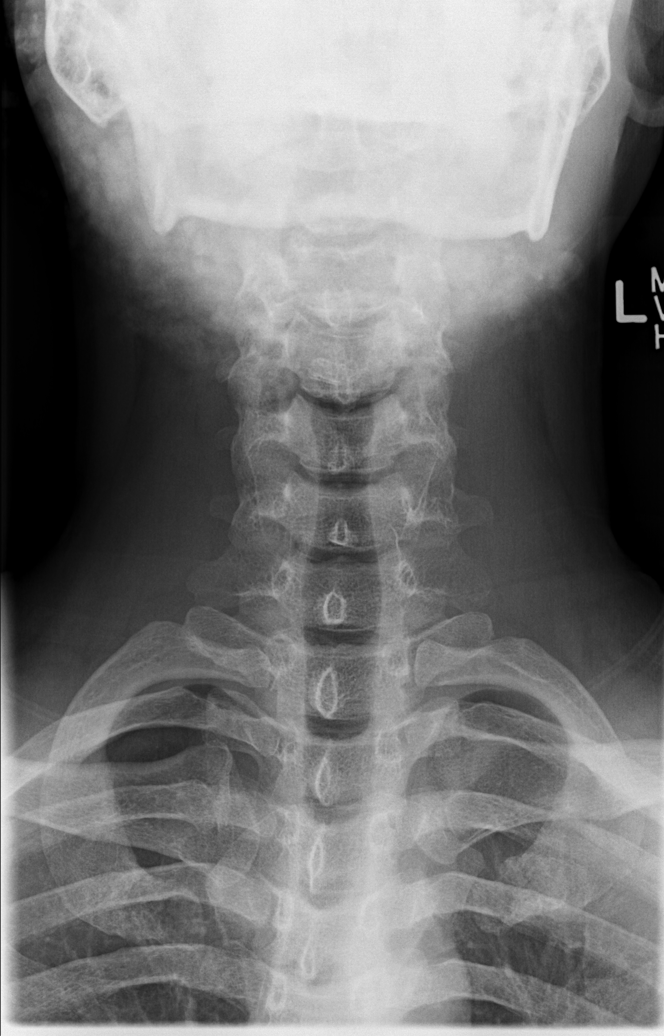

[w cervical spine odontoid (1 of 2)]
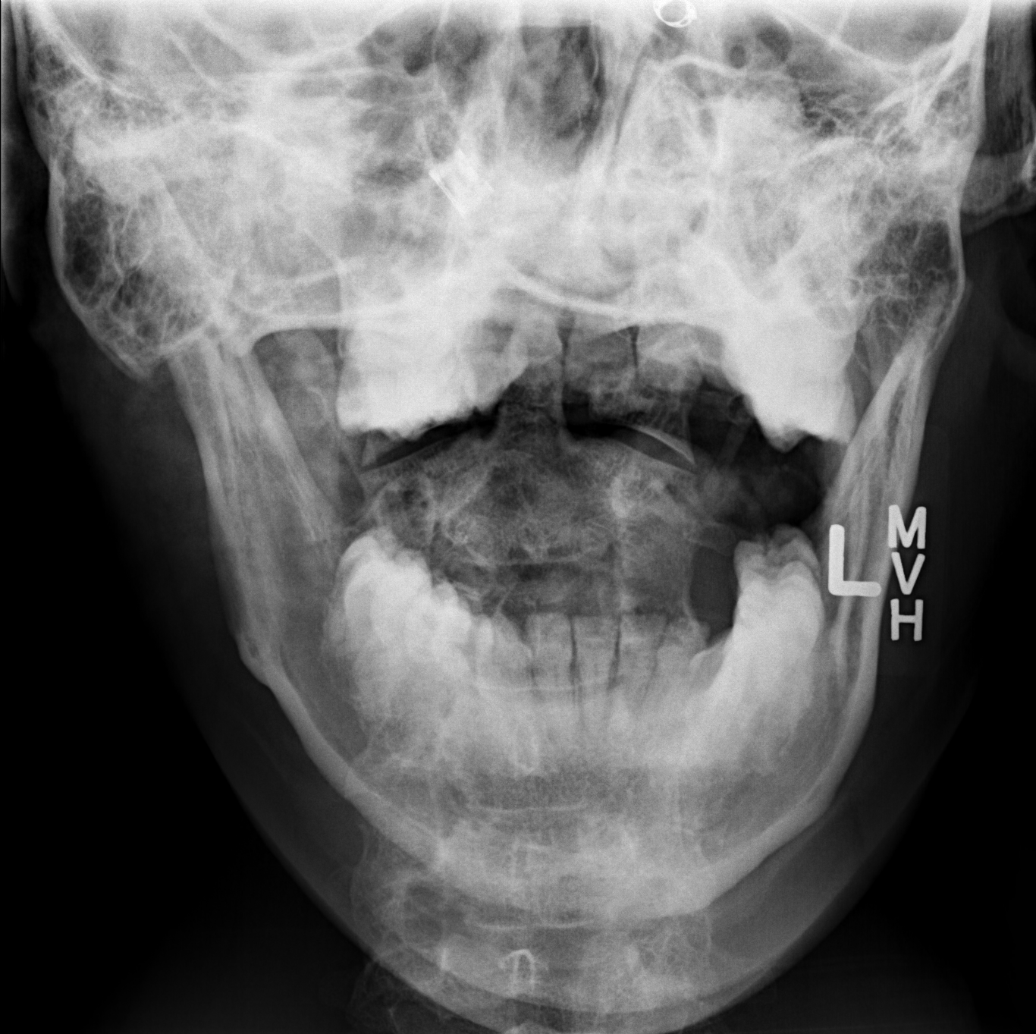

[w cervical spine odontoid (2 of 2)]
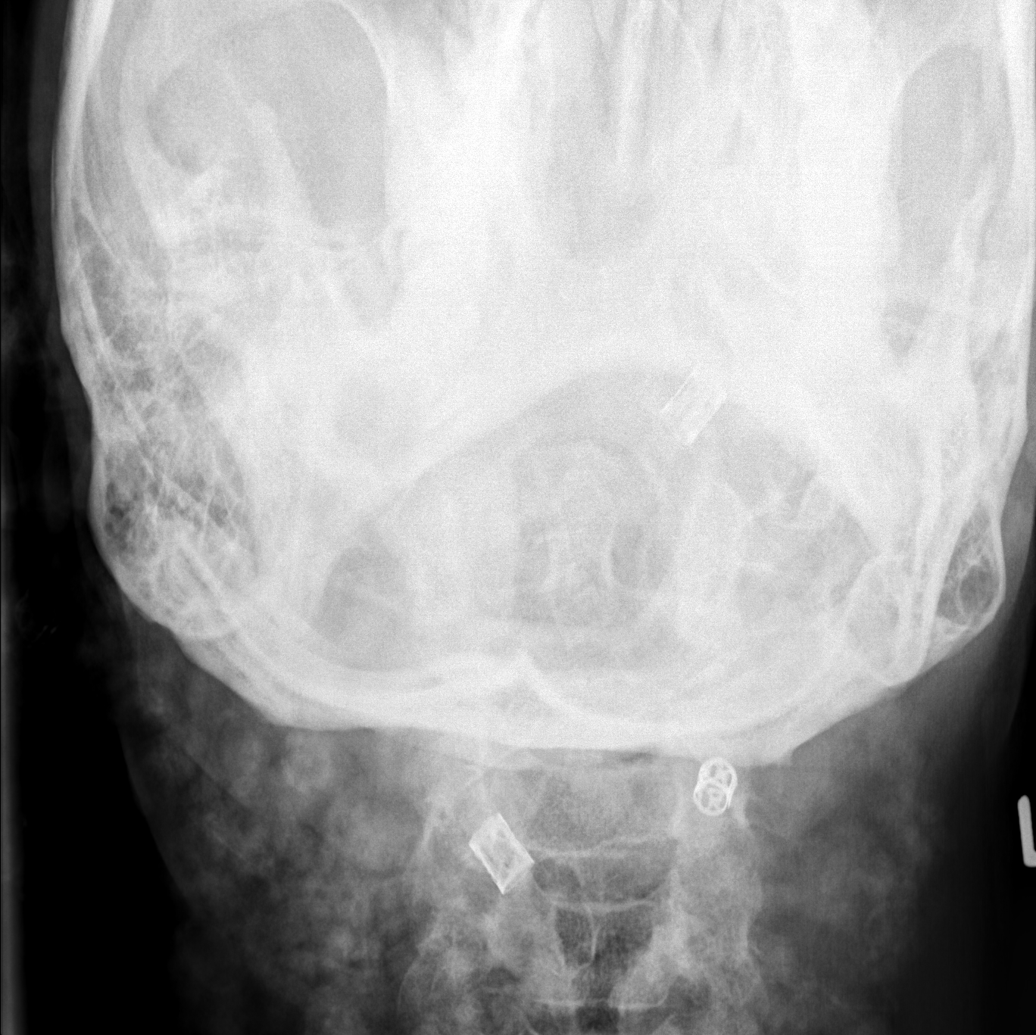

[5 of 5 positions shown; findings below may reference images not displayed]

FINDINGS: There is no evidence of cervical spine fracture or prevertebral soft
tissue swelling. Alignment is normal. No other significant bone
abnormalities are identified.
IMPRESSION: No acute abnormality noted.

## 2018-07-29 ENCOUNTER — Encounter (HOSPITAL_COMMUNITY): Payer: Self-pay | Admitting: Emergency Medicine

## 2018-07-29 ENCOUNTER — Emergency Department (HOSPITAL_COMMUNITY)
Admission: EM | Admit: 2018-07-29 | Discharge: 2018-07-30 | Disposition: A | Discharge status: Home / Self Care | Payer: Self-pay | Attending: Emergency Medicine | Admitting: Emergency Medicine

## 2018-07-29 DIAGNOSIS — R202 Paresthesia of skin: Secondary | ICD-10-CM | POA: Insufficient documentation

## 2018-07-29 DIAGNOSIS — F17210 Nicotine dependence, cigarettes, uncomplicated: Secondary | ICD-10-CM | POA: Insufficient documentation

## 2018-07-29 LAB — COMPREHENSIVE METABOLIC PANEL
ALK PHOS: 69 U/L (ref 38–126)
ALT: 56 U/L — AB (ref 0–44)
AST: 59 U/L — AB (ref 15–41)
Albumin: 4 g/dL (ref 3.5–5.0)
Anion gap: 15 (ref 5–15)
BILIRUBIN TOTAL: 0.9 mg/dL (ref 0.3–1.2)
BUN: 5 mg/dL — AB (ref 6–20)
CALCIUM: 9.2 mg/dL (ref 8.9–10.3)
CO2: 24 mmol/L (ref 22–32)
CREATININE: 0.92 mg/dL (ref 0.61–1.24)
Chloride: 99 mmol/L (ref 98–111)
GFR calc Af Amer: >60 mL/min (ref >60)
GFR calc non Af Amer: >60 mL/min (ref >60)
Glucose, Bld: 80 mg/dL (ref 70–99)
Potassium: 3.4 mmol/L — ABNORMAL LOW (ref 3.5–5.1)
Sodium: 138 mmol/L (ref 135–145)
TOTAL PROTEIN: 7.6 g/dL (ref 6.5–8.1)

## 2018-07-29 LAB — CBC
HCT: 47.6 % (ref 39.0–52.0)
Hemoglobin: 15.7 g/dL (ref 13.0–17.0)
MCH: 32.7 pg (ref 26.0–34.0)
MCHC: 33 g/dL (ref 30.0–36.0)
MCV: 99.2 fL (ref 78.0–100.0)
Platelets: 270 10*3/uL (ref 150–400)
RBC: 4.8 MIL/uL (ref 4.22–5.81)
RDW: 15.5 % (ref 11.5–15.5)
WBC: 5.9 10*3/uL (ref 4.0–10.5)

## 2018-07-29 MED ORDER — DIPHENHYDRAMINE HCL 25 MG PO CAPS
25.0000 mg | ORAL_CAPSULE | Freq: Once | ORAL | Status: DC
  Filled 2018-07-29: qty 1

## 2018-07-29 MED ORDER — DIPHENHYDRAMINE HCL 50 MG/ML IJ SOLN
25.0000 mg | Freq: Once | INTRAMUSCULAR | Status: AC
  Administered 2018-07-29: 25 mg via INTRAVENOUS
  Filled 2018-07-29: qty 1

## 2018-07-29 NOTE — Discharge Instructions (Signed)
Your neurologic exam is normal tonight. You can be discharged home with recommendation to follow up with a primary care provider (resource list provided) for further evaluation if symptoms continue.

## 2018-07-29 NOTE — ED Notes (Signed)
Pt in waiting room requesting to know how many people are in front of him. He is also still drinking bubbly water from earlier.

## 2018-07-29 NOTE — ED Provider Notes (Signed)
[facility] EMERGENCY DEPARTMENT Provider Note   CSN: [id number] Arrival date & time: 07/29/18  1632     History   Chief Complaint Chief Complaint  Patient presents with  . Altered Mental Status    HPI Steve Branch is a 27 y.o. male.  Patient here for evaluation of numb feeling to tongue "and pretty much everywhere" that started about 45 minutes after drinking on OTC drink. Symptoms come and go every few minutes. No weakness, choking, swelling, rash, nausea, vomiting. He denies similar symptoms previously.   The history is provided by the patient. No language interpreter was used.  Altered Mental Status   Pertinent negatives include no weakness.    Past Medical History:  Diagnosis Date  . Migraine     Patient Active Problem List   Diagnosis Date Noted  . FIBRILLATION, ATRIAL 03/20/2010    History reviewed. No pertinent surgical history.      Home Medications    Prior to Admission medications   Medication Sig Start Date End Date Taking? Authorizing Provider  amoxicillin-clavulanate (AUGMENTIN XR) 1000-62.5 MG 12 hr tablet Take 2 tablets by mouth 2 (two) times daily. Patient not taking: Reported on 09/07/2017 07/28/17   Janne Napoleon, NP  clobetasol ointment (TEMOVATE) 0.05 % Apply 1 application topically 2 (two) times daily. To hands only. Patient not taking: Reported on 09/07/2017 07/12/16   Charm Rings, MD  cyclobenzaprine (FLEXERIL) 10 MG tablet Take 1 tablet (10 mg total) by mouth at bedtime. Patient not taking: Reported on 09/07/2017 08/02/17   Michela Pitcher A, PA-C  diclofenac (VOLTAREN) 50 MG EC tablet Take 1 tablet (50 mg total) by mouth 2 (two) times daily. Patient not taking: Reported on 09/07/2017 07/30/16   Elson Areas, PA-C  doxycycline (VIBRAMYCIN) 100 MG capsule Take 1 capsule (100 mg total) by mouth 2 (two) times daily. One po bid x 7 days Patient not taking: Reported on 07/29/2018 09/07/17   Dartha Lodge, PA-C  meloxicam (MOBIC) 15  MG tablet Take 1 tablet (15 mg total) by mouth daily. Patient not taking: Reported on 09/07/2017 07/11/14   Emilia Beck, PA-C  naproxen (NAPROSYN) 500 MG tablet Take 1 tablet (500 mg total) by mouth 2 (two) times daily. Patient not taking: Reported on 09/07/2017 12/20/16   Janne Napoleon, NP    Family History History reviewed. No pertinent family history.  Social History Social History   Tobacco Use  . Smoking status: Current Every Day Smoker    Packs/day: 0.50    Types: Cigarettes  . Smokeless tobacco: Never Used  Substance Use Topics  . Alcohol use: Yes    Comment: ocaasional.  . Drug use: No     Allergies   Patient has no known allergies.   Review of Systems Review of Systems  Constitutional: Negative for chills and fever.  HENT: Negative.  Negative for facial swelling and trouble swallowing.   Respiratory: Negative.  Negative for shortness of breath and wheezing.   Cardiovascular: Negative.   Gastrointestinal: Negative.  Negative for vomiting.  Musculoskeletal: Negative.   Skin: Negative.   Neurological: Positive for numbness. Negative for speech difficulty, weakness, light-headedness and headaches.     Physical Exam Updated Vital Signs BP 133/84 (BP Location: Right Arm)   Pulse 60   Temp 99.3 F (37.4 C) (Oral)   Resp 12   SpO2 100%   Physical Exam  Constitutional: He is oriented to person, place, and time. He appears well-developed and well-nourished.  HENT:  Head: Normocephalic.  Sensation to bilateral tongue intact, equal to pinprick. Intact gag reflex. No intraoral swelling.  Neck: Normal range of motion. Neck supple. Carotid bruit is not present.  Cardiovascular: Normal rate and regular rhythm.  No murmur heard. Pulmonary/Chest: Effort normal and breath sounds normal.  Abdominal: Soft. Bowel sounds are normal. There is no tenderness. There is no rebound and no guarding.  Musculoskeletal: Normal range of motion.  Neurological: He is alert and  oriented to person, place, and time. He has normal strength. No sensory deficit. He exhibits normal muscle tone. Coordination normal. GCS eye subscore is 4. GCS verbal subscore is 5. GCS motor subscore is 6.  CN's 3-12 grossly intact  Skin: Skin is warm and dry. No rash noted.  Psychiatric: He has a normal mood and affect.     ED Treatments / Results  Labs (all labs ordered are listed, but only abnormal results are displayed) Labs Reviewed  COMPREHENSIVE METABOLIC PANEL - Abnormal; Notable for the following components:      Result Value   Potassium 3.4 (*)    BUN 5 (*)    AST 59 (*)    ALT 56 (*)    All other components within normal limits  CBC    EKG None  Radiology No results found.  Procedures Procedures (including critical care time)  Medications Ordered in ED Medications  diphenhydrAMINE (BENADRYL) injection 25 mg (25 mg Intravenous Given 07/29/18 1659)     Initial Impression / Assessment and Plan / ED Course  I have reviewed the triage vital signs and the nursing notes.  Pertinent labs & imaging results that were available during my care of the patient were reviewed by me and considered in my medical decision making (see chart for details).     Patient here with c/o numbness in diffuse distribution after drinking OTC water drink. He denies drugs. He reports one [employer]acardi Lemonade today.   The patient is seen after a 6 hour wait due to high census in department. Symptoms persist, although asymptomatic now. No evidence or concern for allergic reaction. Consider anxiety/panic, however, symptoms have lasted longer than is typical. No history of same.   He has a normal neurologic exam now. He is swallowing without difficulty with intact gag reflex. VSS. He is felt appropriate for discharge home. Recommend PCP follow up if symptoms persist. Referrals provided.   Final Clinical Impressions(s) / ED Diagnoses   Final diagnoses:  None   1. Paresthesias  ED Discharge  Orders    None       Elpidio AnisUpstill, Cherell Colvin, PA-C 07/29/18 2347    Melene PlanFloyd, Dan, DO 07/29/18 2353

## 2018-07-29 NOTE — ED Triage Notes (Signed)
Pt presents to ED after drinking a bottle of [place] water 1 hour ago, states 45 minutes ago he started to experience tongue numbness, bilateral.  No facial droop or arm drift noted in triage.  Patient airway intact.

## 2020-04-15 ENCOUNTER — Ambulatory Visit: Payer: Self-pay | Attending: Internal Medicine

## 2020-06-16 ENCOUNTER — Emergency Department (HOSPITAL_COMMUNITY)
Admission: EM | Admit: 2020-06-16 | Discharge: 2020-06-16 | Disposition: A | Discharge status: Home / Self Care | Payer: Self-pay | Attending: Emergency Medicine | Admitting: Emergency Medicine

## 2020-06-16 ENCOUNTER — Other Ambulatory Visit: Payer: Self-pay

## 2020-06-16 ENCOUNTER — Encounter (HOSPITAL_COMMUNITY): Payer: Self-pay | Admitting: Emergency Medicine

## 2020-06-16 DIAGNOSIS — R519 Headache, unspecified: Secondary | ICD-10-CM | POA: Insufficient documentation

## 2020-06-16 DIAGNOSIS — F17210 Nicotine dependence, cigarettes, uncomplicated: Secondary | ICD-10-CM | POA: Insufficient documentation

## 2020-06-16 MED ORDER — ACETAMINOPHEN 500 MG PO TABS
1000.0000 mg | ORAL_TABLET | Freq: Once | ORAL | Status: AC
  Administered 2020-06-16: 09:00:00 1000 mg via ORAL
  Filled 2020-06-16: qty 2

## 2020-06-16 MED ORDER — SODIUM CHLORIDE 0.9 % IV BOLUS
500.0000 mL | Freq: Once | INTRAVENOUS | Status: AC
  Administered 2020-06-16: 09:00:00 500 mL via INTRAVENOUS

## 2020-06-16 MED ORDER — BUTALBITAL-APAP-CAFFEINE 50-325-40 MG PO TABS: 1.0000 | ORAL_TABLET | Freq: Four times a day (QID) | ORAL | 0 refills | Status: AC | PRN

## 2020-06-16 MED ORDER — DIPHENHYDRAMINE HCL 25 MG PO CAPS
25.0000 mg | ORAL_CAPSULE | Freq: Once | ORAL | Status: AC
  Administered 2020-06-16: 09:00:00 25 mg via ORAL
  Filled 2020-06-16: qty 1

## 2020-06-16 MED ORDER — METOCLOPRAMIDE HCL 5 MG/ML IJ SOLN
10.0000 mg | Freq: Once | INTRAMUSCULAR | Status: AC
  Administered 2020-06-16: 09:00:00 10 mg via INTRAVENOUS
  Filled 2020-06-16: qty 2

## 2020-06-16 MED ORDER — DEXAMETHASONE SODIUM PHOSPHATE 10 MG/ML IJ SOLN
10.0000 mg | Freq: Once | INTRAMUSCULAR | Status: AC
  Administered 2020-06-16: 10:00:00 10 mg via INTRAVENOUS
  Filled 2020-06-16: qty 1

## 2020-06-16 NOTE — Discharge Instructions (Addendum)
You were seen in the ER for headache  I think your headache may be due to tension in the muscles of your neck, possibly rebound headache from overuse of ibuprofen  You were given IV medicines to help with the migraine  Try to cut back on how much and how often you are using ibuprofen.  Can use Fioricet for headache that is not responding to 1 dose of ibuprofen or acetaminophen.  Use over-the-counter nasal sprays to help with any sinus congestion, congestion in your sinuses can cause a headache as well.  I do not think you have a sinus infection and there is no need for antibiotics  If you continue to have migraines or headaches not responding to the medicines above, contact neurology for further evaluation of headaches.  Return to the ER for sudden onset severe headache, vision changes, neck pain or stiffness, fevers, stroke symptoms

## 2020-06-16 NOTE — ED Triage Notes (Addendum)
Pt presents to ED POV. Pt c/o intermittent HA x2w. Pt states at its worst he has blurred vision and photo-sensitivity. Pt states pain if all on R side. Neuro intact

## 2020-06-16 NOTE — ED Provider Notes (Signed)
[facility] EMERGENCY DEPARTMENT Provider Note   CSN: [id number] Arrival date & time: 06/16/20  0500     History Chief Complaint  Patient presents with  . Headache    Steve Branch is a 29 y.o. male with reported past medical history of migraines presents to the ER for evaluation of headaches.  Onset 2 weeks ago.  Describes sharp right-sided pain on the right side of his head, sometimes worse in the posterior head/neck.  He has intermittent sharp pains in the right eye, photosensitivity.  States when the headache gets bad he sweats, gets a tickle in his throat that makes him cough.  The headache typically begins mild in the morning and gets worse at the end of the day.  Has noticed some mild sinus congestion but no drainage.  His mom gave him some unknown antibiotic thinking it was a sinus headache.  Has taken ibuprofen without any relief.  Takes ibuprofen 3-4 times a day.  Sitting in a dark room and not moving symptoms makes him feel little bit better.  History of headaches in the past and he states they feel kind of similar but in the last 2 weeks they have been more severe than usual.  Currently headache is 5/10.  States his uncle had history of migraines as well.  Denies fever, head trauma, oral anticoagulants.  No double vision, loss of vision or blurred vision.  No nausea or vomiting.  No unilateral weakness.HPI     Past Medical History:  Diagnosis Date  . Migraine     Patient Active Problem List   Diagnosis Date Noted  . FIBRILLATION, ATRIAL 03/20/2010    History reviewed. No pertinent surgical history.     History reviewed. No pertinent family history.  Social History   Tobacco Use  . Smoking status: Current Every Day Smoker    Packs/day: 0.50    Types: Cigarettes  . Smokeless tobacco: Never Used  Substance Use Topics  . Alcohol use: Yes    Comment: ocaasional.  . Drug use: No    Home Medications Prior to Admission medications     Medication Sig Start Date End Date Taking? Authorizing Provider  amoxicillin-clavulanate (AUGMENTIN XR) 1000-62.5 MG 12 hr tablet Take 2 tablets by mouth 2 (two) times daily. Patient not taking: Reported on 09/07/2017 07/28/17   Janne Napoleon, NP  butalbital-acetaminophen-caffeine [facility]) [id number] MG tablet Take 1-2 tablets by mouth every 6 (six) hours as needed for headache. 06/16/20 06/16/21  Liberty Handy, PA-C  clobetasol ointment (TEMOVATE) 0.05 % Apply 1 application topically 2 (two) times daily. To hands only. Patient not taking: Reported on 09/07/2017 07/12/16   Charm Rings, MD  cyclobenzaprine (FLEXERIL) 10 MG tablet Take 1 tablet (10 mg total) by mouth at bedtime. Patient not taking: Reported on 09/07/2017 08/02/17   Michela Pitcher A, PA-C  diclofenac (VOLTAREN) 50 MG EC tablet Take 1 tablet (50 mg total) by mouth 2 (two) times daily. Patient not taking: Reported on 09/07/2017 07/30/16   Elson Areas, PA-C  doxycycline (VIBRAMYCIN) 100 MG capsule Take 1 capsule (100 mg total) by mouth 2 (two) times daily. One po bid x 7 days Patient not taking: Reported on 07/29/2018 09/07/17   Dartha Lodge, PA-C  meloxicam (MOBIC) 15 MG tablet Take 1 tablet (15 mg total) by mouth daily. Patient not taking: Reported on 09/07/2017 07/11/14   Emilia Beck, PA-C  naproxen (NAPROSYN) 500 MG tablet Take 1 tablet (500 mg total) by mouth  2 (two) times daily. Patient not taking: Reported on 09/07/2017 12/20/16   Janne Napoleon, NP    Allergies    Patient has no known allergies.  Review of Systems   Review of Systems  HENT: Positive for congestion.   Eyes: Positive for pain and visual disturbance.  Neurological: Positive for headaches.  All other systems reviewed and are negative.   Physical Exam Updated Vital Signs BP 119/64 (BP Location: Right Arm)   Pulse 60   Temp 98.2 F (36.8 C) (Oral)   Resp 16   Ht 5\' 10"  (1.778 m)   SpO2 98%   BMI 33.72 kg/m   Physical Exam Constitutional:       Appearance: He is well-developed. He is not toxic-appearing.     Comments: NAD.  HENT:     Head: Normocephalic and atraumatic.     Comments: No temporal or scalp bone tenderness     Right Ear: External ear normal.     Left Ear: External ear normal.     Nose: Nose normal. No septal deviation or mucosal edema.     Comments: No sinus tenderness  Eyes:     General: Lids are normal.     Conjunctiva/sclera: Conjunctivae normal.     Comments: Unable to visualize back of eye  Neck:     Comments: No c spine spinous process or muscular tenderness  Full PROM of neck w/o rigidity  No meningeal signs  Cardiovascular:     Rate and Rhythm: Normal rate and regular rhythm.     Heart sounds: Normal heart sounds.  Pulmonary:     Effort: Pulmonary effort is normal.     Breath sounds: Normal breath sounds.  Lymphadenopathy:     Comments: No cervical adenopathy  Skin:    General: Skin is warm and dry.     Capillary Refill: Capillary refill takes less than 2 seconds.     Findings: No rash.  Neurological:     Mental Status: He is alert.     GCS: GCS eye subscore is 4. GCS verbal subscore is 5. GCS motor subscore is 6.     Comments:   Mental Status: Patient is awake, alert, oriented to person, place, year, and situation. Patient is able to give a clear and coherent history.  Speech is fluent and clear without dysarthria or aphasia.   Cranial Nerves: I not tested II visual fields full bilaterally. PERRL.  Unable to visualize posterior eye. III, IV, VI EOMs intact without ptosis or diplopia  V sensation to light touch intact in all 3 divisions of trigeminal nerve bilaterally  VII facial movements symmetric bilaterally VIII hearing intact to voice/conversation  IX, X no uvula deviation, symmetric rise of soft palate/uvula XI 5/5 SCM and trapezius strength bilaterally  XII tongue protrusion midline, symmetric L/R movements  Motor: Strength 5/5 in upper/lower extremities .   Sensation to light  touch intact in face, upper/lower extremities. No pronator drift. No leg drop.  Cerebellar: No ataxia with finger to nose.   Psychiatric:        Speech: Speech normal.        Behavior: Behavior normal.        Thought Content: Thought content normal.        Judgment: Judgment normal.     ED Results / Procedures / Treatments   Labs (all labs ordered are listed, but only abnormal results are displayed) Labs Reviewed - No data to display  EKG None  Radiology No  results found.  Procedures Procedures (including critical care time)  Medications Ordered in ED Medications  metoCLOPramide (REGLAN) injection 10 mg (10 mg Intravenous Given 06/16/20 0927)  acetaminophen (TYLENOL) tablet 1,000 mg (1,000 mg Oral Given 06/16/20 0926)  dexamethasone (DECADRON) injection 10 mg (10 mg Intravenous Given 06/16/20 0931)  sodium chloride 0.9 % bolus 500 mL (500 mLs Intravenous Bolus from Bag 06/16/20 0929)  diphenhydrAMINE (BENADRYL) capsule 25 mg (25 mg Oral Given 06/16/20 0630)    ED Course  I have reviewed the triage vital signs and the nursing notes.  Pertinent labs & imaging results that were available during my care of the patient were reviewed by me and considered in my medical decision making (see chart for details).    MDM Rules/Calculators/A&P                           This patient complains of headache with sinus congestion, photosensitivity. Using ibuprofen 3-4 times a day. History of migraines.   I obtained additional history from triage note and previous medical records available.  Overall history and exam is benign. The differential diagnosis includes sinus headache, tension headache, analgesic rebound headache.  Doubt ICH, meningitis, temporal arteritis.  No trauma.   Patient reported headache 5/10.  Offered fioricet and discharge vs IV migraine cocktail.  He opted for IV medicines.   I ordered medication including reglan, benadryl PO, tylenol, decadron and IVF.  No  indication for emergent imaging or laboratory studies today.   I re-evaluated patient and he had significant improvement in headache.   Will dc with fioricet, neurology follow up. Return precautions given. Warned about overuse of NSAIDs.  Final Clinical Impression(s) / ED Diagnoses Final diagnoses:  Recurrent headache    Rx / DC Orders ED Discharge Orders         Ordered    butalbital-acetaminophen-caffeine (FIORICET) 50-325-40 MG tablet  Every 6 hours PRN     Discontinue     06/16/20 1010           Kinnie Feil, [place] 06/16/20 1010    Gareth Morgan, MD 06/16/20 2205

## 2021-08-13 ENCOUNTER — Emergency Department (HOSPITAL_COMMUNITY): Payer: Self-pay

## 2021-08-13 ENCOUNTER — Other Ambulatory Visit: Payer: Self-pay

## 2021-08-13 ENCOUNTER — Encounter (HOSPITAL_COMMUNITY): Payer: Self-pay | Admitting: Emergency Medicine

## 2021-08-13 ENCOUNTER — Inpatient Hospital Stay (HOSPITAL_COMMUNITY)
Admission: EM | Admit: 2021-08-13 | Discharge: 2021-08-16 | DRG: 164 | Disposition: A | Discharge status: Home / Self Care | Payer: Self-pay | Attending: Pulmonary Disease | Admitting: Pulmonary Disease

## 2021-08-13 DIAGNOSIS — F1010 Alcohol abuse, uncomplicated: Secondary | ICD-10-CM | POA: Diagnosis present

## 2021-08-13 DIAGNOSIS — F17210 Nicotine dependence, cigarettes, uncomplicated: Secondary | ICD-10-CM | POA: Diagnosis present

## 2021-08-13 DIAGNOSIS — Z20822 Contact with and (suspected) exposure to covid-19: Secondary | ICD-10-CM | POA: Diagnosis present

## 2021-08-13 DIAGNOSIS — R042 Hemoptysis: Secondary | ICD-10-CM | POA: Diagnosis not present

## 2021-08-13 DIAGNOSIS — K0510 Chronic gingivitis, plaque induced: Secondary | ICD-10-CM | POA: Diagnosis present

## 2021-08-13 DIAGNOSIS — I82452 Acute embolism and thrombosis of left peroneal vein: Secondary | ICD-10-CM | POA: Diagnosis present

## 2021-08-13 DIAGNOSIS — I82442 Acute embolism and thrombosis of left tibial vein: Secondary | ICD-10-CM | POA: Diagnosis present

## 2021-08-13 DIAGNOSIS — I82432 Acute embolism and thrombosis of left popliteal vein: Secondary | ICD-10-CM | POA: Diagnosis present

## 2021-08-13 DIAGNOSIS — I2602 Saddle embolus of pulmonary artery with acute cor pulmonale: Secondary | ICD-10-CM

## 2021-08-13 DIAGNOSIS — I2692 Saddle embolus of pulmonary artery without acute cor pulmonale: Principal | ICD-10-CM | POA: Diagnosis present

## 2021-08-13 DIAGNOSIS — Z79899 Other long term (current) drug therapy: Secondary | ICD-10-CM

## 2021-08-13 LAB — BASIC METABOLIC PANEL
Anion gap: 13 (ref 5–15)
BUN: 5 mg/dL — ABNORMAL LOW (ref 6–20)
CO2: 19 mmol/L — ABNORMAL LOW (ref 22–32)
Calcium: 8.8 mg/dL — ABNORMAL LOW (ref 8.9–10.3)
Chloride: 104 mmol/L (ref 98–111)
Creatinine, Ser: 0.9 mg/dL (ref 0.61–1.24)
GFR, Estimated: >60 mL/min (ref >60)
Glucose, Bld: 101 mg/dL — ABNORMAL HIGH (ref 70–99)
Potassium: 3.3 mmol/L — ABNORMAL LOW (ref 3.5–5.1)
Sodium: 136 mmol/L (ref 135–145)

## 2021-08-13 LAB — CBC WITH DIFFERENTIAL/PLATELET
Abs Immature Granulocytes: 0.05 10*3/uL (ref 0.00–0.07)
Basophils Absolute: 0 10*3/uL (ref 0.0–0.1)
Basophils Relative: 0 %
Eosinophils Absolute: 0.1 10*3/uL (ref 0.0–0.5)
Eosinophils Relative: 1 %
HCT: 48.4 % (ref 39.0–52.0)
Hemoglobin: 17 g/dL (ref 13.0–17.0)
Immature Granulocytes: 1 %
Lymphocytes Relative: 36 %
Lymphs Abs: 2.6 10*3/uL (ref 0.7–4.0)
MCH: 35 pg — ABNORMAL HIGH (ref 26.0–34.0)
MCHC: 35.1 g/dL (ref 30.0–36.0)
MCV: 99.6 fL (ref 80.0–100.0)
Monocytes Absolute: 0.7 10*3/uL (ref 0.1–1.0)
Monocytes Relative: 10 %
Neutro Abs: 3.6 10*3/uL (ref 1.7–7.7)
Neutrophils Relative %: 52 %
Platelets: 192 10*3/uL (ref 150–400)
RBC: 4.86 MIL/uL (ref 4.22–5.81)
RDW: 14.3 % (ref 11.5–15.5)
WBC: 7 10*3/uL (ref 4.0–10.5)
nRBC: 0 % (ref 0.0–0.2)

## 2021-08-13 LAB — TROPONIN I (HIGH SENSITIVITY)
Troponin I (High Sensitivity): 53 ng/L — ABNORMAL HIGH (ref <18)
Troponin I (High Sensitivity): 102 ng/L (ref <18)

## 2021-08-13 MED ORDER — ASPIRIN 81 MG PO CHEW
324.0000 mg | CHEWABLE_TABLET | Freq: Once | ORAL | Status: AC
  Administered 2021-08-14: 324 mg via ORAL
  Filled 2021-08-13: qty 4

## 2021-08-13 NOTE — Consult Note (Signed)
Cardiology Consult Note:   [name] MRN: [id number]; DOB: 1991-12-10   Admission date: 08/13/2021  PCP:  Patient, No Pcp Per (Inactive)   [facility] Providers Cardiologist:  None        Chief Complaint:  CP  Patient Profile:   Steve Branch is a 30 y.o. male with no significant PMHx aside from migraines who is being seen 08/13/2021 for the evaluation of CP and abnormal EKG.  History of Present Illness:   Steve Branch has no prior cardiac hx aside from "inflammation of my heart about 2-3 years ago" presents to the ED tonight c/o CP that has been going on for the past week or so. He states that he feels OK at rest, but whenever he does anything other than sit still, he will get a sharp pain in the center of his chest associated with SOB, nausea, dizziness, and diaphoresis. This pain radiates up to both his shoulders with exertion. All the sx resolve with rest.  Pt also gives h/o recent pain and swelling in the L leg that occurred several days prior to onset of his CP. He states he had swelling in just the left leg with tenderness and pain in the calf/lower leg that resolved fairly spontaneously prior to onset of his CP/SOB. In the ED, initial HS trop was minimally elevated at 53, with repeat 102.  EKG shows ST with HR 100, S1Q3T3 pattern concerning for possible PE. There are TWI in V1-V3 that were not present on prior tracings (can also indicate RV strain in setting of PE).   Past Medical History:  Diagnosis Date   Migraine     History reviewed. No pertinent surgical history.   Medications Prior to Admission: Prior to Admission medications   Medication Sig Start Date End Date Taking? Authorizing Provider  ibuprofen (ADVIL) 200 MG tablet Take 200 mg by mouth every 6 (six) hours as needed for headache or moderate pain.   Yes [provider]  amoxicillin-clavulanate (AUGMENTIN XR) 1000-62.5 MG 12 hr tablet Take 2 tablets by mouth 2 (two) times  daily. Patient not taking: No sig reported 07/28/17   Kerrie Buffalo M, NP  clobetasol ointment (TEMOVATE) 0.05 % Apply 1 application topically 2 (two) times daily. To hands only. Patient not taking: No sig reported 07/12/16   Charm Rings, MD  cyclobenzaprine (FLEXERIL) 10 MG tablet Take 1 tablet (10 mg total) by mouth at bedtime. Patient not taking: No sig reported 08/02/17   Michela Pitcher A, PA-C  diclofenac (VOLTAREN) 50 MG EC tablet Take 1 tablet (50 mg total) by mouth 2 (two) times daily. Patient not taking: No sig reported 07/30/16   Elson Areas, PA-C  doxycycline (VIBRAMYCIN) 100 MG capsule Take 1 capsule (100 mg total) by mouth 2 (two) times daily. One po bid x 7 days Patient not taking: No sig reported 09/07/17   Dartha Lodge, PA-C  meloxicam (MOBIC) 15 MG tablet Take 1 tablet (15 mg total) by mouth daily. Patient not taking: No sig reported 07/11/14   Emilia Beck, PA-C  naproxen (NAPROSYN) 500 MG tablet Take 1 tablet (500 mg total) by mouth 2 (two) times daily. Patient not taking: No sig reported 12/20/16   Janne Napoleon, NP     Allergies:   No Known Allergies  Social History:   Social History   Socioeconomic History   Marital status: Single    Spouse name: Not on file   Number of children: Not on file  Years of education: Not on file   Highest education level: Not on file  Occupational History   Not on file  Tobacco Use   Smoking status: Every Day    Packs/day: 0.50    Types: Cigarettes   Smokeless tobacco: Never  Substance and Sexual Activity   Alcohol use: Yes    Comment: ocaasional.   Drug use: No   Sexual activity: Not on file  Other Topics Concern   Not on file  Social History Narrative   Not on file   Social Determinants of Health   Financial Resource Strain: Not on file  Food Insecurity: Not on file  Transportation Needs: Not on file  Physical Activity: Not on file  Stress: Not on file  Social Connections: Not on file  Intimate Partner Violence:  Not on file    Family History:   The patient's family history is not on file.    ROS:  Please see the history of present illness.  All other ROS reviewed and negative.     Physical Exam/Data:   Vitals:   08/13/21 2011 08/13/21 2235  BP: 135/86 115/80  Pulse: (!) 120 (!) 109  Resp: 20 17  Temp: 99.1 F (37.3 C)   TempSrc: Oral   SpO2: 98% 99%   No intake or output data in the 24 hours ending 08/13/21 2359 Last 3 Weights 09/07/2017 12/20/2016 06/08/2016  Weight (lbs) 235 lb 240 lb 220 lb  Weight (kg) 106.595 kg 108.863 kg 99.791 kg     There is no height or weight on file to calculate BMI.  General:  Well nourished, well developed, in no acute distress HEENT: normal Lymph: no adenopathy Neck: no JVD Endocrine:  No thryomegaly Vascular: No carotid bruits; DP pulses 2+ bilaterally  Cardiac:  normal S1, S2; RRR; no murmur  Lungs:  clear to auscultation bilaterally, no wheezing, rhonchi or rales  Abd: soft, nontender, no hepatomegaly  Ext: no edema Musculoskeletal:  No deformities Skin: warm and dry  Neuro:  no focal abnormalities noted Psych:  Normal affect    EKG:  The ECG that was done 08-13-21 was personally reviewed and demonstrates ST with HR 100, S1Q3T3 pattern and TWI in V1-V3, all c/w R-sided strain  Relevant CV Studies: none  Laboratory Data:  High Sensitivity Troponin:   Recent Labs  Lab 08/13/21 2023 08/13/21 2218  TROPONINIHS 53* 102*      Chemistry Recent Labs  Lab 08/13/21 2023  NA 136  K 3.3*  CL 104  CO2 19*  GLUCOSE 101*  BUN 5*  CREATININE 0.90  CALCIUM 8.8*  GFRNONAA >60  ANIONGAP 13    No results for input(s): PROT, ALBUMIN, AST, ALT, ALKPHOS, BILITOT in the last 168 hours. Hematology Recent Labs  Lab 08/13/21 2023  WBC 7.0  RBC 4.86  HGB 17.0  HCT 48.4  MCV 99.6  MCH 35.0*  MCHC 35.1  RDW 14.3  PLT 192   BNPNo results for input(s): BNP, PROBNP in the last 168 hours.  DDimer No results for input(s): DDIMER in the last  168 hours.   Radiology/Studies:  DG Chest 2 View  Result Date: 08/13/2021 CLINICAL DATA:  Chest pain EXAM: CHEST - 2 VIEW COMPARISON:  09/07/2017 FINDINGS: The heart size and mediastinal contours are within normal limits. Both lungs are clear. The visualized skeletal structures are unremarkable. IMPRESSION: No active cardiopulmonary disease. Electronically Signed   By: Wiliam Ke M.D.   On: 08/13/2021 20:41     Assessment  and Plan:   CP/SOB: clinical history, EKG findings, and mildly elevated troponin all highly suggestive of significant PE with RV strain. Recommend heparin IV gtt and CTA to evaluate for PE. I doubt this is a primary ACS/cardiac event but there may be associated RV strain as indicated on EKG. Would also get TTE to eval LV and RV function.    Risk Assessment/Risk Scores:    HEAR Score (for undifferentiated chest pain):  HEAR Score: 1     Thank you for the opportunity to participate in the care of this patient.  For questions or updates, please contact [facility] Please consult [email or web address] for contact info under   Precious Reel, MD , [facility] 08/14/21 12:32 AM

## 2021-08-13 NOTE — ED Provider Notes (Addendum)
[facility] Provider Note   CSN: [id number] Arrival date & time: 08/13/21  2005     History Chief Complaint  Patient presents with   Chest Pain    Steve Branch is a 30 y.o. male.  Patient presents with exertional chest pain or shortness of breath intermittent for the past 1 week.  States at rest he feels well.  However when he tries to ambulate more than a few steps he gets pain in the center of his chest associate with shortness of breath, nausea, dizziness and diaphoresis.  He also has pain with bilateral shoulders when he tries to ambulate.  This goes away when he rests.  He is not have any pain currently.  Has had this on and off for the past 1 week only when he exerts himself.  No vomiting.  No abdominal pain.  No cough or fever.  States he had "inflammation around my heart several years ago" but because also of pneumonia.  No leg pain or leg swelling.  Denies any cocaine use. Denies any leg swelling or leg pain.  He denies any cardiac history is never had a stress test or catheterization.  Denies any early family history of early cardiac death.  The history is provided by the patient.  Chest Pain Associated symptoms: diaphoresis, nausea and shortness of breath   Associated symptoms: no back pain, no dizziness, no fever, no headache, no palpitations and no vomiting       Past Medical History:  Diagnosis Date   Migraine     Patient Active Problem List   Diagnosis Date Noted   FIBRILLATION, ATRIAL 03/20/2010    History reviewed. No pertinent surgical history.     No family history on file.  Social History   Tobacco Use   Smoking status: Every Day    Packs/day: 0.50    Types: Cigarettes   Smokeless tobacco: Never  Substance Use Topics   Alcohol use: Yes    Comment: ocaasional.   Drug use: No    Home Medications Prior to Admission medications   Medication Sig Start Date End Date Taking? Authorizing Provider   ibuprofen (ADVIL) 200 MG tablet Take 200 mg by mouth every 6 (six) hours as needed for headache or moderate pain.   Yes [provider]  amoxicillin-clavulanate (AUGMENTIN XR) 1000-62.5 MG 12 hr tablet Take 2 tablets by mouth 2 (two) times daily. Patient not taking: No sig reported 07/28/17   Kerrie BuffaloNeese, Hope M, NP  clobetasol ointment (TEMOVATE) 0.05 % Apply 1 application topically 2 (two) times daily. To hands only. Patient not taking: No sig reported 07/12/16   Charm RingsHonig, Erin J, MD  cyclobenzaprine (FLEXERIL) 10 MG tablet Take 1 tablet (10 mg total) by mouth at bedtime. Patient not taking: No sig reported 08/02/17   Michela PitcherFawze, Mina A, PA-C  diclofenac (VOLTAREN) 50 MG EC tablet Take 1 tablet (50 mg total) by mouth 2 (two) times daily. Patient not taking: No sig reported 07/30/16   Elson AreasSofia, Leslie K, PA-C  doxycycline (VIBRAMYCIN) 100 MG capsule Take 1 capsule (100 mg total) by mouth 2 (two) times daily. One po bid x 7 days Patient not taking: No sig reported 09/07/17   Dartha LodgeFord, Kelsey N, PA-C  meloxicam (MOBIC) 15 MG tablet Take 1 tablet (15 mg total) by mouth daily. Patient not taking: No sig reported 07/11/14   Emilia BeckSzekalski, Kaitlyn, PA-C  naproxen (NAPROSYN) 500 MG tablet Take 1 tablet (500 mg total) by mouth 2 (two)  times daily. Patient not taking: No sig reported 12/20/16   Janne Napoleon, NP    Allergies    Patient has no known allergies.  Review of Systems   Review of Systems  Constitutional:  Positive for diaphoresis. Negative for activity change, appetite change and fever.  HENT:  Negative for congestion and rhinorrhea.   Respiratory:  Positive for chest tightness and shortness of breath.   Cardiovascular:  Positive for chest pain. Negative for palpitations.  Gastrointestinal:  Positive for nausea. Negative for vomiting.  Genitourinary:  Negative for dysuria and hematuria.  Musculoskeletal:  Negative for arthralgias, back pain and myalgias.  Skin:  Negative for rash.  Neurological:  Negative  for dizziness and headaches.   all other systems are negative except as noted in the HPI and PMH.   Physical Exam Updated Vital Signs BP 115/80 (BP Location: Right Arm)   Pulse (!) 109   Temp 99.1 F (37.3 C) (Oral)   Resp 17   SpO2 99%   Physical Exam Vitals and nursing note reviewed.  Constitutional:      General: He is not in acute distress.    Appearance: He is well-developed.  HENT:     Head: Normocephalic and atraumatic.     Mouth/Throat:     Pharynx: No oropharyngeal exudate.  Eyes:     Conjunctiva/sclera: Conjunctivae normal.     Pupils: Pupils are equal, round, and reactive to light.  Neck:     Comments: No meningismus. Cardiovascular:     Rate and Rhythm: Regular rhythm. Tachycardia present.     Heart sounds: Normal heart sounds. No murmur heard. Pulmonary:     Effort: Pulmonary effort is normal. No respiratory distress.     Breath sounds: Normal breath sounds.  Abdominal:     Palpations: Abdomen is soft.     Tenderness: There is no abdominal tenderness. There is no guarding or rebound.  Musculoskeletal:        General: No tenderness. Normal range of motion.     Cervical back: Normal range of motion and neck supple.  Skin:    General: Skin is warm.  Neurological:     Mental Status: He is alert and oriented to person, place, and time.     Cranial Nerves: No cranial nerve deficit.     Motor: No abnormal muscle tone.     Coordination: Coordination normal.     Comments: No ataxia on finger to nose bilaterally. No pronator drift. 5/5 strength throughout. CN 2-12 intact.Equal grip strength. Sensation intact.   Psychiatric:        Behavior: Behavior normal.    ED Results / Procedures / Treatments   Labs (all labs ordered are listed, but only abnormal results are displayed) Labs Reviewed  BASIC METABOLIC PANEL - Abnormal; Notable for the following components:      Result Value   Potassium 3.3 (*)    CO2 19 (*)    Glucose, Bld 101 (*)    BUN 5 (*)     Calcium 8.8 (*)    All other components within normal limits  CBC WITH DIFFERENTIAL/PLATELET - Abnormal; Notable for the following components:   MCH 35.0 (*)    All other components within normal limits  TROPONIN I (HIGH SENSITIVITY) - Abnormal; Notable for the following components:   Troponin I (High Sensitivity) 53 (*)    All other components within normal limits  TROPONIN I (HIGH SENSITIVITY) - Abnormal; Notable for the following components:   Troponin  I (High Sensitivity) 102 (*)    All other components within normal limits    EKG EKG Interpretation  Date/Time:  Wednesday August 13 2021 20:13:17 EDT Ventricular Rate:  115 PR Interval:  138 QRS Duration: 90 QT Interval:  332 QTC Calculation: 459 R Axis:   72 Text Interpretation: Sinus tachycardia Anterior infarct , age undetermined Abnormal ECG new anterior T wave inversions Confirmed by Glynn Octave [phone]) on 08/13/2021 10:51:17 PM  Radiology DG Chest 2 View  Result Date: 08/13/2021 CLINICAL DATA:  Chest pain EXAM: CHEST - 2 VIEW COMPARISON:  09/07/2017 FINDINGS: The heart size and mediastinal contours are within normal limits. Both lungs are clear. The visualized skeletal structures are unremarkable. IMPRESSION: No active cardiopulmonary disease. Electronically Signed   By: Wiliam Ke M.D.   On: 08/13/2021 20:41   CT Angio Chest PE W and/or Wo Contrast  Result Date: 08/14/2021 CLINICAL DATA:  Shortness of breath. EXAM: CT ANGIOGRAPHY CHEST WITH CONTRAST TECHNIQUE: Multidetector CT imaging of the chest was performed using the standard protocol during bolus administration of intravenous contrast. Multiplanar CT image reconstructions and MIPs were obtained to evaluate the vascular anatomy. CONTRAST:  3mL OMNIPAQUE IOHEXOL 350 MG/ML SOLN COMPARISON:  July 28, 2017 FINDINGS: Cardiovascular: An extensive amount of intraluminal low attenuation is seen throughout the bilateral pulmonary arteries. This extends to involve the  bilateral upper lobe, right middle lobe and bilateral lower lobe branches. A large amount of saddle embolus is seen. Normal heart size with right-sided heart strain (RV to LV ratio of 1.56). Flattening of the intraventricular septum is noted. No pericardial effusion. Mediastinum/Nodes: No enlarged mediastinal, hilar, or axillary lymph nodes. Thyroid gland, trachea, and esophagus demonstrate no significant findings. Lungs/Pleura: Lungs are clear. No pleural effusion or pneumothorax. Upper Abdomen: No acute abnormality. Musculoskeletal: No chest wall abnormality. No acute or significant osseous findings. Review of the MIP images confirms the above findings. IMPRESSION: Massive bilateral pulmonary embolism with saddle embolus and associated right heart strain. Electronically Signed   By: Aram Candela M.D.   On: 08/14/2021 03:12    Procedures .Critical Care  Date/Time: 08/13/2021 11:56 PM Performed by: Glynn Octave, MD Authorized by: Glynn Octave, MD   Critical care provider statement:    Critical care time (minutes):  60   Critical care was necessary to treat or prevent imminent or life-threatening deterioration of the following conditions: massive pulmonary embolism.   Critical care was time spent personally by me on the following activities:  Discussions with consultants, evaluation of patient's response to treatment, examination of patient, ordering and performing treatments and interventions, ordering and review of laboratory studies, ordering and review of radiographic studies, pulse oximetry, re-evaluation of patient's condition, obtaining history from patient or surrogate and review of old charts   Medications Ordered in ED Medications  aspirin chewable tablet 324 mg (has no administration in time range)    ED Course  I have reviewed the triage vital signs and the nursing notes.  Pertinent labs & imaging results that were available during my care of the patient were reviewed by  me and considered in my medical decision making (see chart for details).    MDM Rules/Calculators/A&P                           1 week of exertional chest pain with shortness of breath.  EKG is highly abnormal with T wave inversions anteriorly. S1Q3T3 present.  Troponin is 53.  Patient is  given aspirin.  Will consult cardiology. Will also rule out pulmonary embolism.  Discussed with Dr. Katrinka Blazing of interventional cardiology.  EKG is somewhat concerning for Wellen' syndrome.  However patient has no pain currently.  Dr. Katrinka Blazing defers emergent catheterization in the setting of no pain and asked for cardiology fellow evaluation.  Troponin increasing from 53 to 102. IV heparin initiated.  D/w Dr. Daphine Deutscher of cardiology who will evaluate. CTPE pending.  CT confirms massive pulmonary embolism with saddle embolus and right heart strain.  Continue IV heparin.  Dr. Daphine Deutscher has seen patient and agrees with treatment for massive PE rather than ACS.  Blood pressure and mental status remained stable.  No hypoxia on room air. Admission to ICU for consideration of catheter directed lytics discussed with NP Hoffman. May need systemic thrombolytics if becomes unstable.  D/w IR Dr. Loreta Ave. They will evaluate for potential catheter directed lytics.   Final Clinical Impression(s) / ED Diagnoses Final diagnoses:  Acute saddle pulmonary embolism with acute cor pulmonale [facility])    Rx / DC Orders ED Discharge Orders     None        Carole Doner, Jeannett Senior, MD 08/14/21 Asencion Noble, MD 08/14/21 Asencion Noble, MD 08/14/21 [phone]

## 2021-08-13 NOTE — ED Provider Notes (Signed)
Emergency Medicine Provider Triage Evaluation Note  Steve Branch , a 30 y.o. male  was evaluated in triage.  Pt complains of right-sided chest pain with dizziness.  Worse with ambulation and speaking.  Possible history of "inflammation of my heart about 2 to 3 years ago."  Denies any vomiting or abdominal pain.  Review of Systems  Positive: Chest pain, dizziness Negative: Abdominal pain, vomiting  Physical Exam  BP 135/86 (BP Location: Right Arm)   Pulse (!) 120   Temp 99.1 F (37.3 C) (Oral)   Resp 20   SpO2 98%  Gen:   Awake, no distress   Resp:  Normal effort  MSK:   Moves extremities without difficulty  Other:  No respiratory distress  Medical Decision Making  Medically screening exam initiated at 8:13 PM.  Appropriate orders placed.  Haidyn Bickley was informed that the remainder of the evaluation will be completed by another provider, this initial triage assessment does not replace that evaluation, and the importance of remaining in the ED until their evaluation is complete.  Work-up initiated   Dietrich Pates, PA-C 08/13/21 2018    Mancel Bale, MD 08/15/21 [phone]

## 2021-08-13 NOTE — ED Triage Notes (Signed)
Pt reports pressure to right side of chest that moves to the shoulder accompanied w/ dizziness when he walk move than 15 feet X1 week.

## 2021-08-14 ENCOUNTER — Inpatient Hospital Stay (HOSPITAL_COMMUNITY): Payer: Self-pay

## 2021-08-14 ENCOUNTER — Emergency Department (HOSPITAL_COMMUNITY): Payer: Self-pay

## 2021-08-14 ENCOUNTER — Encounter (HOSPITAL_COMMUNITY): Payer: Self-pay | Admitting: Pulmonary Disease

## 2021-08-14 DIAGNOSIS — R079 Chest pain, unspecified: Secondary | ICD-10-CM

## 2021-08-14 DIAGNOSIS — I2692 Saddle embolus of pulmonary artery without acute cor pulmonale: Secondary | ICD-10-CM | POA: Diagnosis present

## 2021-08-14 DIAGNOSIS — R0602 Shortness of breath: Secondary | ICD-10-CM

## 2021-08-14 HISTORY — PX: IR ANGIOGRAM FOLLOW UP STUDY: IMG697

## 2021-08-14 HISTORY — PX: IR THROMB F/U EVAL ART/VEN FINAL DAY (MS): IMG5379

## 2021-08-14 HISTORY — PX: IR ANGIOGRAM SELECTIVE EACH ADDITIONAL VESSEL: IMG667

## 2021-08-14 HISTORY — PX: IR ANGIOGRAM PULMONARY BILATERAL SELECTIVE: IMG664

## 2021-08-14 HISTORY — PX: IR INFUSION THROMBOL ARTERIAL INITIAL (MS): IMG5376

## 2021-08-14 LAB — LIPID PANEL
Cholesterol: 141 mg/dL (ref 0–200)
HDL: 68 mg/dL (ref >40)
LDL Cholesterol: 57 mg/dL (ref 0–99)
Total CHOL/HDL Ratio: 2.1 RATIO
Triglycerides: 80 mg/dL (ref <150)
VLDL: 16 mg/dL (ref 0–40)

## 2021-08-14 LAB — CBC
HCT: 46.1 % (ref 39.0–52.0)
HCT: 47.3 % (ref 39.0–52.0)
Hemoglobin: 15.8 g/dL (ref 13.0–17.0)
Hemoglobin: 16.2 g/dL (ref 13.0–17.0)
MCH: 34.8 pg — ABNORMAL HIGH (ref 26.0–34.0)
MCH: 34.3 pg — ABNORMAL HIGH (ref 26.0–34.0)
MCHC: 34.3 g/dL (ref 30.0–36.0)
MCHC: 34.2 g/dL (ref 30.0–36.0)
MCV: 101.5 fL — ABNORMAL HIGH (ref 80.0–100.0)
MCV: 100.2 fL — ABNORMAL HIGH (ref 80.0–100.0)
Platelets: 176 10*3/uL (ref 150–400)
Platelets: 177 10*3/uL (ref 150–400)
RBC: 4.54 MIL/uL (ref 4.22–5.81)
RBC: 4.72 MIL/uL (ref 4.22–5.81)
RDW: 14.6 % (ref 11.5–15.5)
RDW: 14.2 % (ref 11.5–15.5)
WBC: 6.5 10*3/uL (ref 4.0–10.5)
WBC: 8.2 10*3/uL (ref 4.0–10.5)
nRBC: 0 % (ref 0.0–0.2)
nRBC: 0 % (ref 0.0–0.2)

## 2021-08-14 LAB — FIBRINOGEN
Fibrinogen: 451 mg/dL (ref 210–475)
Fibrinogen: 454 mg/dL (ref 210–475)

## 2021-08-14 LAB — HEMOGLOBIN A1C
Hgb A1c MFr Bld: 5.5 % (ref 4.8–5.6)
Mean Plasma Glucose: 111.15 mg/dL

## 2021-08-14 LAB — RESP PANEL BY RT-PCR (FLU A&B, COVID) ARPGX2
Influenza A by PCR: NEGATIVE
Influenza B by PCR: NEGATIVE
SARS Coronavirus 2 by RT PCR: NEGATIVE

## 2021-08-14 LAB — HEPARIN LEVEL (UNFRACTIONATED)
Heparin Unfractionated: 0.5 IU/mL (ref 0.30–0.70)
Heparin Unfractionated: 0.36 IU/mL (ref 0.30–0.70)
Heparin Unfractionated: 0.42 IU/mL (ref 0.30–0.70)

## 2021-08-14 LAB — ECHOCARDIOGRAM COMPLETE
Area-P 1/2: 3.37 cm2
Calc EF: 44.5 %
Height: 70 in
S' Lateral: 3.6 cm
Single Plane A2C EF: 43.8 %
Single Plane A4C EF: 44.2 %
Weight: 3760.17 oz

## 2021-08-14 LAB — HIV ANTIBODY (ROUTINE TESTING W REFLEX): HIV Screen 4th Generation wRfx: NONREACTIVE

## 2021-08-14 LAB — GLUCOSE, CAPILLARY: Glucose-Capillary: 94 mg/dL (ref 70–99)

## 2021-08-14 LAB — MAGNESIUM: Magnesium: 2.1 mg/dL (ref 1.7–2.4)

## 2021-08-14 LAB — PHOSPHORUS: Phosphorus: 3.9 mg/dL (ref 2.5–4.6)

## 2021-08-14 LAB — MRSA NEXT GEN BY PCR, NASAL: MRSA by PCR Next Gen: NOT DETECTED

## 2021-08-14 LAB — TROPONIN I (HIGH SENSITIVITY): Troponin I (High Sensitivity): 44 ng/L — ABNORMAL HIGH (ref <18)

## 2021-08-14 MED ORDER — MIDAZOLAM HCL 2 MG/2ML IJ SOLN
INTRAMUSCULAR | Status: AC | PRN
Start: 2021-08-14 — End: 2021-08-14
  Administered 2021-08-14 (×2): 1 mg via INTRAVENOUS

## 2021-08-14 MED ORDER — HEPARIN BOLUS VIA INFUSION
4000.0000 [IU] | Freq: Once | INTRAVENOUS | Status: AC
  Administered 2021-08-14: 4000 [IU] via INTRAVENOUS
  Filled 2021-08-14: qty 4000

## 2021-08-14 MED ORDER — HEPARIN (PORCINE) 25000 UT/250ML-% IV SOLN
1450.0000 [IU]/h | INTRAVENOUS | Status: AC
  Administered 2021-08-14: 1500 [IU]/h via INTRAVENOUS
  Administered 2021-08-14: 1200 [IU]/h via INTRAVENOUS
  Administered 2021-08-15 (×2): 1450 [IU]/h via INTRAVENOUS
  Filled 2021-08-14 (×4): qty 250

## 2021-08-14 MED ORDER — POTASSIUM CHLORIDE 10 MEQ/100ML IV SOLN
10.0000 meq | INTRAVENOUS | Status: DC
Start: 2021-08-14 — End: 2021-08-14
  Filled 2021-08-14 (×2): qty 100

## 2021-08-14 MED ORDER — CHLORHEXIDINE GLUCONATE CLOTH 2 % EX PADS
6.0000 | MEDICATED_PAD | Freq: Every day | CUTANEOUS | Status: DC
  Administered 2021-08-14 – 2021-08-15 (×2): 6 via TOPICAL

## 2021-08-14 MED ORDER — IOHEXOL 350 MG/ML SOLN
80.0000 mL | Freq: Once | INTRAVENOUS | Status: AC | PRN
  Administered 2021-08-14: 80 mL via INTRAVENOUS

## 2021-08-14 MED ORDER — FENTANYL CITRATE (PF) 100 MCG/2ML IJ SOLN
INTRAMUSCULAR | Status: AC
  Filled 2021-08-14: qty 2

## 2021-08-14 MED ORDER — FENTANYL CITRATE (PF) 100 MCG/2ML IJ SOLN
INTRAMUSCULAR | Status: AC | PRN
  Administered 2021-08-14: 50 ug via INTRAVENOUS
  Administered 2021-08-14: 25 ug via INTRAVENOUS

## 2021-08-14 MED ORDER — LIDOCAINE HCL 1 % IJ SOLN
INTRAMUSCULAR | Status: AC
  Filled 2021-08-14: qty 20

## 2021-08-14 MED ORDER — HEPARIN BOLUS VIA INFUSION
3000.0000 [IU] | Freq: Once | INTRAVENOUS | Status: AC
  Administered 2021-08-14: 3000 [IU] via INTRAVENOUS
  Filled 2021-08-14: qty 3000

## 2021-08-14 MED ORDER — PHENOBARBITAL 32.4 MG PO TABS
32.4000 mg | ORAL_TABLET | Freq: Two times a day (BID) | ORAL | Status: DC
  Administered 2021-08-14: 32.4 mg via ORAL
  Filled 2021-08-14: qty 1

## 2021-08-14 MED ORDER — SODIUM CHLORIDE 0.9 % IV SOLN: INTRAVENOUS | Status: DC

## 2021-08-14 MED ORDER — SODIUM CHLORIDE 0.9% FLUSH
3.0000 mL | Freq: Two times a day (BID) | INTRAVENOUS | Status: DC
  Administered 2021-08-14 – 2021-08-15 (×2): 3 mL via INTRAVENOUS

## 2021-08-14 MED ORDER — POTASSIUM CHLORIDE 10 MEQ/100ML IV SOLN
10.0000 meq | INTRAVENOUS | Status: DC
  Administered 2021-08-14: 10 meq via INTRAVENOUS

## 2021-08-14 MED ORDER — SODIUM CHLORIDE 0.9 % IV SOLN
12.0000 mg | Freq: Once | INTRAVENOUS | Status: AC
  Administered 2021-08-14: 12 mg via INTRAVENOUS
  Filled 2021-08-14: qty 12

## 2021-08-14 MED ORDER — MIDAZOLAM HCL 2 MG/2ML IJ SOLN
INTRAMUSCULAR | Status: AC
  Filled 2021-08-14: qty 2

## 2021-08-14 MED ORDER — POTASSIUM CHLORIDE CRYS ER 20 MEQ PO TBCR: 40.0000 meq | EXTENDED_RELEASE_TABLET | Freq: Once | ORAL | Status: DC

## 2021-08-14 MED ORDER — POTASSIUM CHLORIDE CRYS ER 20 MEQ PO TBCR
40.0000 meq | EXTENDED_RELEASE_TABLET | Freq: Four times a day (QID) | ORAL | Status: AC
  Administered 2021-08-14: 40 meq via ORAL
  Filled 2021-08-14 (×2): qty 2

## 2021-08-14 MED ORDER — HEPARIN SODIUM (PORCINE) 1000 UNIT/ML IJ SOLN
INTRAMUSCULAR | Status: AC
  Filled 2021-08-14: qty 1

## 2021-08-14 MED ORDER — DOCUSATE SODIUM 100 MG PO CAPS: 100.0000 mg | ORAL_CAPSULE | Freq: Two times a day (BID) | ORAL | Status: DC | PRN

## 2021-08-14 MED ORDER — SODIUM CHLORIDE 0.9% FLUSH: 3.0000 mL | INTRAVENOUS | Status: DC | PRN

## 2021-08-14 MED ORDER — PANTOPRAZOLE SODIUM 40 MG IV SOLR
40.0000 mg | Freq: Every day | INTRAVENOUS | Status: DC
  Administered 2021-08-14 – 2021-08-15 (×2): 40 mg via INTRAVENOUS
  Filled 2021-08-14 (×2): qty 40

## 2021-08-14 MED ORDER — ONDANSETRON HCL 4 MG/2ML IJ SOLN: 4.0000 mg | Freq: Four times a day (QID) | INTRAMUSCULAR | Status: DC | PRN

## 2021-08-14 MED ORDER — SODIUM CHLORIDE 0.9 % IV SOLN: 250.0000 mL | INTRAVENOUS | Status: DC | PRN

## 2021-08-14 MED ORDER — POLYETHYLENE GLYCOL 3350 17 G PO PACK: 17.0000 g | PACK | Freq: Every day | ORAL | Status: DC | PRN

## 2021-08-14 NOTE — Progress Notes (Addendum)
Progress Note  Patient Name: Steve Branch Date of Encounter: 08/14/2021  [facility] Cardiologist:New  Subjective   Presented with chest pain and shortness of breath found to have saddle PE. Hs trop mildly elevated. On IV heparin and admitted to ICU.  Inpatient Medications    Scheduled Meds:  pantoprazole (PROTONIX) IV  40 mg Intravenous QHS   Continuous Infusions:  heparin 1,500 Units/hr (08/14/21 0351)   PRN Meds: docusate sodium, ondansetron (ZOFRAN) IV, polyethylene glycol   Vital Signs    Vitals:   08/14/21 0430 08/14/21 0656 08/14/21 0745 08/14/21 0800  BP: 120/87 119/79 104/77 110/72  Pulse: 95 90    Resp: (!) 24 19 20 17   Temp:      TempSrc:      SpO2: 98% 97%    Weight:   106.6 kg   Height:   5\' 10"  (1.778 m)    No intake or output data in the 24 hours ending 08/14/21 0825 Last 3 Weights 08/14/2021 09/07/2017 12/20/2016  Weight (lbs) 235 lb 0.2 oz 235 lb 240 lb  Weight (kg) 106.6 kg 106.595 kg 108.863 kg      Telemetry    NSR HR80s - Personally Reviewed  ECG    ST, 100bpm TWI anteroseptal leads, possible repol abnormalities - Personally Reviewed  Physical Exam   GEN: No acute distress.   Neck: minimal JVD Cardiac: RRR, no murmurs, rubs, or gallops.  Respiratory: Clear to auscultation bilaterally. GI: Soft, nontender, non-distended  MS: No edema; No deformity. Neuro:  Nonfocal  Psych: Normal affect   Labs    High Sensitivity Troponin:   Recent Labs  Lab 08/13/21 2023 08/13/21 2218  TROPONINIHS 53* 102*      Chemistry Recent Labs  Lab 08/13/21 2023  NA 136  K 3.3*  CL 104  CO2 19*  GLUCOSE 101*  BUN 5*  CREATININE 0.90  CALCIUM 8.8*  GFRNONAA >60  ANIONGAP 13     Hematology Recent Labs  Lab 08/13/21 2023  WBC 7.0  RBC 4.86  HGB 17.0  HCT 48.4  MCV 99.6  MCH 35.0*  MCHC 35.1  RDW 14.3  PLT 192    BNPNo results for input(s): BNP, PROBNP in the last 168 hours.   DDimer No results for input(s): DDIMER in  the last 168 hours.   Radiology    DG Chest 2 View  Result Date: 08/13/2021 CLINICAL DATA:  Chest pain EXAM: CHEST - 2 VIEW COMPARISON:  09/07/2017 FINDINGS: The heart size and mediastinal contours are within normal limits. Both lungs are clear. The visualized skeletal structures are unremarkable. IMPRESSION: No active cardiopulmonary disease. Electronically Signed   By: 08/15/2021 M.D.   On: 08/13/2021 20:41   CT Angio Chest PE W and/or Wo Contrast  Result Date: 08/14/2021 CLINICAL DATA:  Shortness of breath. EXAM: CT ANGIOGRAPHY CHEST WITH CONTRAST TECHNIQUE: Multidetector CT imaging of the chest was performed using the standard protocol during bolus administration of intravenous contrast. Multiplanar CT image reconstructions and MIPs were obtained to evaluate the vascular anatomy. CONTRAST:  40mL OMNIPAQUE IOHEXOL 350 MG/ML SOLN COMPARISON:  July 28, 2017 FINDINGS: Cardiovascular: An extensive amount of intraluminal low attenuation is seen throughout the bilateral pulmonary arteries. This extends to involve the bilateral upper lobe, right middle lobe and bilateral lower lobe branches. A large amount of saddle embolus is seen. Normal heart size with right-sided heart strain (RV to LV ratio of 1.56). Flattening of the intraventricular septum is noted. No pericardial effusion. Mediastinum/Nodes:  No enlarged mediastinal, hilar, or axillary lymph nodes. Thyroid gland, trachea, and esophagus demonstrate no significant findings. Lungs/Pleura: Lungs are clear. No pleural effusion or pneumothorax. Upper Abdomen: No acute abnormality. Musculoskeletal: No chest wall abnormality. No acute or significant osseous findings. Review of the MIP images confirms the above findings. IMPRESSION: Massive bilateral pulmonary embolism with saddle embolus and associated right heart strain. Electronically Signed   By: Aram Candela M.D.   On: 08/14/2021 03:12    Cardiac Studies   Echo ordered  Patient Profile      30 y.o. male with h/o migraines, alcohol use, tobacco use who is being seen for chest pain and abnormal EKG.  Assessment & Plan    Saddle pulmonary embolism with Right heart strain - chest pain and shortness of breath with syncope along with intermittent lower leg swelling for the last week,, found to have saddle PE. CT showed RV strain.  - emergent echo at bedside showed RV strain with normal LV function - admitted to ICU and started on IV heparin.  - Interventional radiology consulted - Lower extremity dopplers - per CCM  Chest pain and Abnormal EKG Elevated troponin - CP and SOB in the setting of the above - EKG showed ST, 115bpm wth TWI ant/septal, III and AVF - HS troponin 53>>102 - POC echo performed at bedside, according to notes positive for RV strain and normal LVEF. Will order complete echo - no prior cardiac history. RF include smoking, family history, (uncle with MI in 35s) - risk stratify with A1C and lipid panel - continue to trend troponin, suspect mostly demand ischemia in the setting of PE. Suspect further work-up as outpatient  Alcohol use - reports 2 pints liquor daily - CIWA per IM  Tobacco use - smoker 1ppd - cessation encouraged  For questions or updates, please contact [facility] Please consult [email or web address] for contact info under   Signed, Cadence David Stall, PA-C  08/14/2021, 8:25 AM    As above, patient seen and examined.  Patient was admitted with chest pain and dyspnea.  He has been found to have a saddle pulmonary embolus.  This explains his electrocardiographic changes and minimally elevated troponin.  If echocardiogram shows preserved LV function there will be no plans for further cardiac evaluation.  Further treatment of pulmonary embolus per critical care medicine. No risk factors for pulmonary embolus; will need hypercoaguable WU. Note covid negative. Olga Millers, MD

## 2021-08-14 NOTE — TOC Initial Note (Signed)
Transition of Care [facility]) - Initial/Assessment Note    Patient Details  Name: Steve Branch MRN: [id number] Date of Birth: January 11, 1991  Transition of [facility]) CM/SW Contact:    Lockie Pares, RN Phone Number: 08/14/2021, 9:53 AM  Clinical Narrative:                   29y ear old uninsured patient in with massive PE. . Will possibly need MATCH, and ongoing care, will refer and make appointment for post hospital visit at [facility]. Will need to be on DOAC, no eligibility sent for since patient is uninsured. Depending on medication, will obtain cards for discounts. DC MEDICATIONS SHOULD GO TO TOC pharmacy. Patient currently in the ICU CM will follow for needs and transitions.   Expected Discharge Plan: Home/Self Care Barriers to Discharge: Continued Medical Work up   Patient Goals and CMS Choice        Expected Discharge Plan and Services Expected Discharge Plan: Home/Self Care   Discharge Planning Services: CM Consult   Living arrangements for the past 2 months: Single Family Home                                      Prior Living Arrangements/Services Living arrangements for the past 2 months: Single Family Home   Patient language and need for interpreter reviewed:: Yes        Need for Family Participation in Patient Care: Yes (Comment) Care giver support system in place?: Yes (comment)   Criminal Activity/Legal Involvement Pertinent to Current Situation/Hospitalization: No - Comment as needed  Activities of Daily Living      Permission Sought/Granted                  Emotional Assessment       Orientation: : Oriented to Self, Oriented to  Time, Oriented to Place, Oriented to Situation Alcohol / Substance Use: Not Applicable Psych Involvement: No (comment)  Admission diagnosis:  Acute saddle pulmonary embolism (HCC) [I26.92] Acute saddle pulmonary embolism with acute cor pulmonale (HCC) [I26.02] Patient Active Problem  List   Diagnosis Date Noted   Acute saddle pulmonary embolism (HCC) 08/14/2021   FIBRILLATION, ATRIAL 03/20/2010   PCP:  Patient, No Pcp Per (Inactive) Pharmacy:   [facility] [address] [place] - [address] [place] [address] Phone: [phone] Fax: [phone]  [id number] - [place] - [phone] AT [facility] [address] & [place] [place] [address] Phone: [phone] Fax: [phone]     Social Determinants of Health (SDOH) Interventions    Readmission Risk Interventions No flowsheet data found.

## 2021-08-14 NOTE — ED Notes (Signed)
Edson Snowball, mom, [phone] would like updates when available

## 2021-08-14 NOTE — ED Notes (Signed)
Report given to Louie Bun, RN of [phone]

## 2021-08-14 NOTE — Progress Notes (Signed)
Patient transported to 80M Room 8 with RN and IR techs. TPA therapy connected to catheters/sheath at bedside. Pump and bag concentrations of TPA and saline verified with receiving RN Cala Bradford at bedside.

## 2021-08-14 NOTE — Procedures (Addendum)
Interventional Radiology Procedure Note  Procedure:   Initiation of catheter directed PE lysis. US guided access of right CFV x 2, for placement of bilateral lysis catheters.  Initiate tPA lysis.  1mg /hr x 12 hours via each catheter, for total anticipated of 24mg  in 12 hours.   Findings: Main PA pressure: 63/18 (35)  Complications: None  Recommendations:  - right hip straight while the catheters/sheaths are in place - ICU status - tPA ggt x 12 hours. - Q 6 hr H&H, fibrinogen, heparin level - Pharmacy will manage low dose heparin protocol, thank you - Do not submerge - Call with any new head ache or mental status changes, gi bleed - expect some right hip discomfort given the presence of the sheaths.  - VIR to follow  Signed,  . , DO

## 2021-08-14 NOTE — ED Notes (Signed)
Mother at  The bedside 

## 2021-08-14 NOTE — ED Notes (Signed)
Nasal 02 at 2 liters

## 2021-08-14 NOTE — Progress Notes (Signed)
ANTICOAGULATION CONSULT NOTE - Follow Up Consult  Pharmacy Consult for Heparin Indication: pulmonary embolus  No Known Allergies  Patient Measurements: Height: 5\' 10"  (177.8 cm) Weight: 106.6 kg (235 lb 0.2 oz) IBW/kg (Calculated) : 73 Heparin Dosing Weight: 95.9 kg  Vital Signs: Temp: 98.5 F (36.9 C) (08/18 1100) Temp Source: Oral (08/18 1100) BP: 111/79 (08/18 1249) Pulse Rate: 70 (08/18 1249)  Labs: Recent Labs    08/13/21 2023 08/13/21 2218 08/14/21 1111  HGB 17.0  --   --   HCT 48.4  --   --   PLT 192  --   --   HEPARINUNFRC  --   --  0.50  CREATININE 0.90  --   --   TROPONINIHS 53* 102* 44*    Estimated Creatinine Clearance: 146.7 mL/min (by C-G formula based on SCr of 0.9 mg/dL).   Medications:  Scheduled:   Chlorhexidine Gluconate Cloth  6 each Topical Daily   pantoprazole (PROTONIX) IV  40 mg Intravenous QHS   potassium chloride  40 mEq Oral Q6H    Assessment: Heparin being given for saddle pulmonary embolism. IR plans to treat with catheter-directed thrombolysis with plans to continue heparin. Patient is therapeutic with a heparin level of 0.5. Patient has stable H/H and platelets with no reported signs or symptoms of bleeding. The heparin level is expected to rise after the initiation of thrombolytics, so a heparin level will be evaluated 6 hours after thrombolytic initiation.   Goal of Therapy:  Heparin level 0.3-0.7 units/ml Monitor platelets by anticoagulation protocol: Yes   Plan:  Continue heparin 1500 units/hour  Continue to monitor H&H and platelets Check Xa level at 1930  Thank you for allowing pharmacy to participate in this patient's care.  08/16/21, PharmD PGY1 Pharmacy Resident 08/14/2021 1:12 PM Check [email or web address] for unit specific pharmacy number

## 2021-08-14 NOTE — ED Notes (Signed)
Attempted to call report

## 2021-08-14 NOTE — H&P (Signed)
NAMEKwesi Branch, MRN:  [id number], DOB:  March 28, 1991, LOS: 0 ADMISSION DATE:  08/13/2021, CONSULTATION DATE: 08/14/2021 REFERRING MD: Rancour, CHIEF COMPLAINT: Pulmonary embolism  History of Present Illness:  This is a 30 year old black male that presented to the emergency room with increasing shortness of breath.  Patient states that approximately 2 weeks ago he started having left lower extremity edema and moderate discomfort.  He noted that this acutely improved at which time he became very dyspneic on exertion which progressed to shortness of breath at rest.  He had had a dull substernal chest discomfort that started about the same time as the shortness of breath that started.  Patient has had several episodes of near syncope her last several days.  On presentation emergency room patient had a CAT scan that demonstrated saddle pulmonary embolism.  Per the reading of the CAT scan there was RV strain.  Emergent echocardiogram was performed at bedside that demonstrated RV strain with intact LV function.  PE risk factors: Patient lives a sedentary lifestyle.  Active smoker There is a history of sudden cardiac death in 2 of his uncles unknown if there pulmonary embolism related.  No miscarriages noted in the family.  Social history:  Drinks extensive alcohol on a daily basis.  Patient was unable to quantify the amount. Patient smokes 1 to 2 packs/day of cigarettes. No significant IVDA history. Pertinent  Medical History  Migraine headaches TBI as a teenager  Significant Hospital Events: Including procedures, antibiotic start and stop dates in addition to other pertinent events     Objective   Blood pressure 119/79, pulse 90, temperature 99.1 F (37.3 C), temperature source Oral, resp. rate 19, SpO2 97 %.       No intake or output data in the 24 hours ending 08/14/21 7564 There were no vitals filed for this visit.  Examination: General: No acute distress HENT:  Atraumatic/normocephalic mucous membranes are moist Lungs: Clear to auscultation bilaterally no wheezing rales or rhonchi noted. Cardiovascular: Regular rate no murmur Abdomen: soft, nontender, nondistended, no rebound/rigidity/guarding, positive bowel sounds.   Extremity: Distal pulse intact x4.  No significant edema.  No cyanosis or clubbing noted. Neuro: Conscious alert and oriented x3.  Significant motor deficits.  No focal deficits.   Assessment & Plan:  Saddle pulmonary embolism Right heart strain Daily alcohol abuse 1 to 2 packs/day smoker  Plan: Admit patient to the intensive care unit for further work-up. Consult discussed with interventional radiology.  I have asked them to evaluate for possible mechanical versus localized pharmacologic thrombectomy. Currently on nasal cannula. Point-of-care echocardiogram was performed.  LV appears to be intact but the RV appears to be dilated with a classic strain pattern.  IVC was mildly dilated. Patient was started on heparin infusion. Lower extremity Dopplers have been ordered. Monitor closely for alcohol withdrawal.  Patient states that he can go days without alcohol without any adverse effects. N.p.o.   Best Practice (right click and "Reselect all SmartList Selections" daily)   Diet/type: NPO DVT prophylaxis: systemic heparin GI prophylaxis: PPI Lines: N/A Foley:  N/A Code Status:  full code   Labs   CBC: Recent Labs  Lab 08/13/21 2023  WBC 7.0  NEUTROABS 3.6  HGB 17.0  HCT 48.4  MCV 99.6  PLT 192    Basic Metabolic Panel: Recent Labs  Lab 08/13/21 2023  NA 136  K 3.3*  CL 104  CO2 19*  GLUCOSE 101*  BUN 5*  CREATININE 0.90  CALCIUM 8.8*  GFR: CrCl cannot be calculated (Unknown ideal weight.). Recent Labs  Lab 08/13/21 2023  WBC 7.0    Liver Function Tests: No results for input(s): AST, ALT, ALKPHOS, BILITOT, PROT, ALBUMIN in the last 168 hours. No results for input(s): LIPASE, AMYLASE in the  last 168 hours. No results for input(s): AMMONIA in the last 168 hours.  ABG    Component Value Date/Time   TCO2 31 03/04/2010 0102     Coagulation Profile: No results for input(s): INR, PROTIME in the last 168 hours.  Cardiac Enzymes: No results for input(s): CKTOTAL, CKMB, CKMBINDEX, TROPONINI in the last 168 hours.  HbA1C: No results found for: HGBA1C  CBG: No results for input(s): GLUCAP in the last 168 hours.  Review of Systems:   See HPI  Past Medical History:  He,  has a past medical history of Migraine.   Surgical History:  History reviewed. No pertinent surgical history.   Social History:   reports that he has been smoking cigarettes. He has been smoking an average of .5 packs per day. He has never used smokeless tobacco. He reports current alcohol use. He reports that he does not use drugs.   Family History:  His family history is not on file.   Allergies No Known Allergies   Home Medications  Prior to Admission medications   Medication Sig Start Date End Date Taking? Authorizing Provider  ibuprofen (ADVIL) 200 MG tablet Take 200 mg by mouth every 6 (six) hours as needed for headache or moderate pain.   Yes [provider]  amoxicillin-clavulanate (AUGMENTIN XR) 1000-62.5 MG 12 hr tablet Take 2 tablets by mouth 2 (two) times daily. Patient not taking: No sig reported 07/28/17   Kerrie Buffalo M, NP  clobetasol ointment (TEMOVATE) 0.05 % Apply 1 application topically 2 (two) times daily. To hands only. Patient not taking: No sig reported 07/12/16   Charm Rings, MD  cyclobenzaprine (FLEXERIL) 10 MG tablet Take 1 tablet (10 mg total) by mouth at bedtime. Patient not taking: No sig reported 08/02/17   Michela Pitcher A, PA-C  diclofenac (VOLTAREN) 50 MG EC tablet Take 1 tablet (50 mg total) by mouth 2 (two) times daily. Patient not taking: No sig reported 07/30/16   Elson Areas, PA-C  doxycycline (VIBRAMYCIN) 100 MG capsule Take 1 capsule (100 mg total) by  mouth 2 (two) times daily. One po bid x 7 days Patient not taking: No sig reported 09/07/17   Dartha Lodge, PA-C  meloxicam (MOBIC) 15 MG tablet Take 1 tablet (15 mg total) by mouth daily. Patient not taking: No sig reported 07/11/14   Emilia Beck, PA-C  naproxen (NAPROSYN) 500 MG tablet Take 1 tablet (500 mg total) by mouth 2 (two) times daily. Patient not taking: No sig reported 12/20/16   Janne Napoleon, NP     Critical care time: 

## 2021-08-14 NOTE — Progress Notes (Signed)
ANTICOAGULATION CONSULT NOTE - Follow Up Consult  Pharmacy Consult for Heparin Indication: pulmonary embolus  No Known Allergies  Patient Measurements: Height: 5\' 10"  (177.8 cm) Weight: 106.6 kg (235 lb 0.2 oz) IBW/kg (Calculated) : 73 Heparin Dosing Weight: 95.9 kg  Vital Signs: Temp: 98.7 F (37.1 C) (08/18 2000) Temp Source: Oral (08/18 2000) BP: 125/86 (08/18 1800) Pulse Rate: 80 (08/18 1800)  Labs: Recent Labs    08/13/21 2023 08/13/21 2218 08/14/21 1111 08/14/21 1516 08/14/21 1721 08/14/21 2107  HGB 17.0  --   --  15.8  --  16.2  HCT 48.4  --   --  46.1  --  47.3  PLT 192  --   --  176  --  177  HEPARINUNFRC  --   --  0.50  --  0.36 0.42  CREATININE 0.90  --   --   --   --   --   TROPONINIHS 53* 102* 44*  --   --   --      Estimated Creatinine Clearance: 146.7 mL/min (by C-G formula based on SCr of 0.9 mg/dL).  Assessment: 30 yo M with saddle pulmonary embolism s/p catheter-directed thrombolysis 8/18. Pharmacy consulted for heparin.  Heparin was not stopped during thrombolysis. HL 0.36 is therapeutic. Will not increase at this time given alteplase infusion but can consider increasing slightly to target higher goal range given clot burden after alteplase.  No issues with infusion or bleeding per RN.   Goal of Therapy:  Heparin level 0.3-0.7 units/ml Monitor platelets by anticoagulation protocol: Yes   Plan:  Continue heparin 1500 units/hour  Monitor daily HL, CBC/plt Monitor for signs/symptoms of bleeding    ADDENDUM 22:15- drawing HL and CBC Q6hr per thrombolysis protocol. HL up to 0.42 still therapeutic. Per RN pt coughed up bloody sputum, about 6-7 tissues, mostly sputum but with clear bright blood as well. RN called IR who instructed to halve alteplase to 0.5mg /hr. Will decrease heparin to 1450 units/hr to keep HL at low end of goal.   Thank you for allowing pharmacy to participate in this patient's care.  9/18, PharmD, BCPS, BCCP Clinical  Pharmacist  Please check AMION for all [facility] phone numbers After 10:00 PM, call Main Pharmacy [phone]

## 2021-08-14 NOTE — Progress Notes (Signed)
  Echocardiogram 2D Echocardiogram has been performed.  Steve Branch 08/14/2021, 9:38 AM

## 2021-08-14 NOTE — Progress Notes (Signed)
ANTICOAGULATION CONSULT NOTE - Follow Up Consult  Pharmacy Consult for Heparin Indication: pulmonary embolus  No Known Allergies  Patient Measurements: Height: 5\' 10"  (177.8 cm) Weight: 106.6 kg (235 lb 0.2 oz) IBW/kg (Calculated) : 73 Heparin Dosing Weight: 95.9 kg  Vital Signs: Temp: 98.5 F (36.9 C) (08/18 1100) Temp Source: Oral (08/18 1100) BP: 125/86 (08/18 1800) Pulse Rate: 80 (08/18 1800)  Labs: Recent Labs    08/13/21 2023 08/13/21 2218 08/14/21 1111 08/14/21 1516 08/14/21 1721  HGB 17.0  --   --  15.8  --   HCT 48.4  --   --  46.1  --   PLT 192  --   --  176  --   HEPARINUNFRC  --   --  0.50  --  0.36  CREATININE 0.90  --   --   --   --   TROPONINIHS 53* 102* 44*  --   --      Estimated Creatinine Clearance: 146.7 mL/min (by C-G formula based on SCr of 0.9 mg/dL).  Assessment: 30 yo M with saddle pulmonary embolism s/p catheter-directed thrombolysis 8/18. Pharmacy consulted for heparin.  Heparin was not stopped during thrombolysis. HL 0.36 is therapeutic. Will not increase at this time given alteplase infusion but can consider increasing slightly to target higher goal range given clot burden after alteplase.  No issues with infusion or bleeding per RN.   Goal of Therapy:  Heparin level 0.3-0.7 units/ml Monitor platelets by anticoagulation protocol: Yes   Plan:  Continue heparin 1500 units/hour  Monitor daily HL, CBC/plt Monitor for signs/symptoms of bleeding    Thank you for allowing pharmacy to participate in this patient's care.  9/18, PharmD, BCPS, BCCP Clinical Pharmacist  Please check AMION for all [facility] phone numbers After 10:00 PM, call Main Pharmacy [phone]

## 2021-08-14 NOTE — ED Notes (Signed)
Critical care at  The bedside 

## 2021-08-14 NOTE — ED Notes (Signed)
Chest pain for one week

## 2021-08-14 NOTE — Progress Notes (Signed)
ANTICOAGULATION CONSULT NOTE - Initial Consult  Pharmacy Consult for Heparin  Indication: pulmonary embolus  No Known Allergies  Patient Measurements: ~100 kg  Vital Signs: Temp: 99.1 F (37.3 C) (08/17 2011) Temp Source: Oral (08/17 2011) BP: 108/77 (08/18 0300) Pulse Rate: 97 (08/18 0300)  Labs: Recent Labs    08/13/21 2023 08/13/21 2218  HGB 17.0  --   HCT 48.4  --   PLT 192  --   CREATININE 0.90  --   TROPONINIHS 53* 102*    CrCl cannot be calculated (Unknown ideal weight.).   Medical History: Past Medical History:  Diagnosis Date   Migraine     Assessment: 30 y/o M presents to the ED with chest pain and shortness of breath x 1 week. Initially started on heparin for elevated troponin/ACS>>>now found to have massive bilateral saddle PE. He was started on ACS heparin dosing which will now be adjusted for PE dosing. CBC/renal function are good. PTA meds reviewed.   Goal of Therapy:  Heparin level 0.3-0.7 units/ml Monitor platelets by anticoagulation protocol: Yes   Plan:  Heparin 7000 units BOLUS Heparin drip 1500 units/hr 1000 Heparin level Daily CBC/Heparin level Monitor for bleeding  Abran Duke, PharmD, BCPS Clinical Pharmacist Phone: [phone]

## 2021-08-14 NOTE — Consult Note (Signed)
Chief Complaint: Patient was seen in consultation today for  Chief Complaint  Patient presents with   Chest Pain    Referring Physician(s): Dr. Katrinka Blazing, Critical Care  Supervising Physician: Gilmer Mor  Patient Status: [facility] - In-pt  History of Present Illness: Steve Branch is a 30 y.o. male with no significant past medical history. He presented to the [facility] ED 08/13/21 with complaints of right-sided chest pain, dizziness and shortness of breath of one week's duration. His work up in the ED was significant for T-wave inversion on EKG, troponin of 53 and imaging was positive for a saddle pulmonary embolism with right-sided heart strain.     CT angio chest PE - 08/13/21  Cardiovascular: An extensive amount of intraluminal low attenuation is seen throughout the bilateral pulmonary arteries. This extends to involve the bilateral upper lobe, right middle lobe and bilateral lower lobe branches. A large amount of saddle embolus is seen. Normal heart size with right-sided heart strain (RV to LV ratio of 1.56). Flattening of the intraventricular septum is noted. No pericardial effusion. IMPRESSION: Massive bilateral pulmonary embolism with saddle embolus and associated right heart strain.   Interventional Radiology has been asked to evaluate this patient for image-guided pulmonary embolism treatment, mechanical versus pharmacological. This case was reviewed and procedure approved by Dr. Loreta Ave.   Past Medical History:  Diagnosis Date   Migraine     History reviewed. No pertinent surgical history.  Allergies: Patient has no known allergies.  Medications: Prior to Admission medications   Medication Sig Start Date End Date Taking? Authorizing Provider  ibuprofen (ADVIL) 200 MG tablet Take 200 mg by mouth every 6 (six) hours as needed for headache or moderate pain.   Yes [provider]  amoxicillin-clavulanate (AUGMENTIN XR) 1000-62.5 MG 12 hr tablet Take 2 tablets by  mouth 2 (two) times daily. Patient not taking: No sig reported 07/28/17   Kerrie Buffalo M, NP  clobetasol ointment (TEMOVATE) 0.05 % Apply 1 application topically 2 (two) times daily. To hands only. Patient not taking: No sig reported 07/12/16   Charm Rings, MD  cyclobenzaprine (FLEXERIL) 10 MG tablet Take 1 tablet (10 mg total) by mouth at bedtime. Patient not taking: No sig reported 08/02/17   Michela Pitcher A, PA-C  diclofenac (VOLTAREN) 50 MG EC tablet Take 1 tablet (50 mg total) by mouth 2 (two) times daily. Patient not taking: No sig reported 07/30/16   Elson Areas, PA-C  doxycycline (VIBRAMYCIN) 100 MG capsule Take 1 capsule (100 mg total) by mouth 2 (two) times daily. One po bid x 7 days Patient not taking: No sig reported 09/07/17   Dartha Lodge, PA-C  meloxicam (MOBIC) 15 MG tablet Take 1 tablet (15 mg total) by mouth daily. Patient not taking: No sig reported 07/11/14   Emilia Beck, PA-C  naproxen (NAPROSYN) 500 MG tablet Take 1 tablet (500 mg total) by mouth 2 (two) times daily. Patient not taking: No sig reported 12/20/16   Janne Napoleon, NP     History reviewed. No pertinent family history.  Social History   Socioeconomic History   Marital status: Single    Spouse name: Not on file   Number of children: Not on file   Years of education: Not on file   Highest education level: Not on file  Occupational History   Not on file  Tobacco Use   Smoking status: Every Day    Packs/day: 1.00    Types: Cigarettes   Smokeless  tobacco: Never  Vaping Use   Vaping Use: Never used  Substance and Sexual Activity   Alcohol use: Yes    Comment: ocaasional.   Drug use: No   Sexual activity: Not on file  Other Topics Concern   Not on file  Social History Narrative   Not on file   Social Determinants of Health   Financial Resource Strain: Not on file  Food Insecurity: Not on file  Transportation Needs: Not on file  Physical Activity: Not on file  Stress: Not on file   Social Connections: Not on file    Review of Systems: A 12 point ROS discussed and pertinent positives are indicated in the HPI above.  All other systems are negative.  Review of Systems  Constitutional:  Positive for fatigue.  Respiratory:  Positive for shortness of breath. Negative for cough.   Cardiovascular:  Positive for chest pain. Negative for leg swelling.  Gastrointestinal:  Negative for abdominal pain, diarrhea, nausea and vomiting.  Neurological:  Positive for dizziness. Negative for headaches.   Vital Signs: BP 117/89   Pulse 81   Temp 98.5 F (36.9 C) (Oral)   Resp 17   Ht 5\' 10"  (1.778 m)   Wt 235 lb 0.2 oz (106.6 kg)   SpO2 97%   BMI 33.72 kg/m   Physical Exam Constitutional:      General: He is not in acute distress.    Appearance: He is not ill-appearing.  HENT:     Mouth/Throat:     Mouth: Mucous membranes are dry.     Pharynx: Oropharynx is clear.  Cardiovascular:     Rate and Rhythm: Normal rate and regular rhythm.     Pulses: Normal pulses.     Heart sounds: Normal heart sounds.  Pulmonary:     Effort: Pulmonary effort is normal.     Breath sounds: Normal breath sounds.  Abdominal:     General: Bowel sounds are normal.     Palpations: Abdomen is soft.     Tenderness: There is no abdominal tenderness.  Musculoskeletal:     Right lower leg: No edema.     Left lower leg: No edema.  Skin:    General: Skin is warm and dry.  Neurological:     Mental Status: He is alert and oriented to person, place, and time.    Imaging: DG Chest 2 View  Result Date: 08/13/2021 CLINICAL DATA:  Chest pain EXAM: CHEST - 2 VIEW COMPARISON:  09/07/2017 FINDINGS: The heart size and mediastinal contours are within normal limits. Both lungs are clear. The visualized skeletal structures are unremarkable. IMPRESSION: No active cardiopulmonary disease. Electronically Signed   By: 11/07/2017 M.D.   On: 08/13/2021 20:41   CT Angio Chest PE W and/or Wo  Contrast  Result Date: 08/14/2021 CLINICAL DATA:  Shortness of breath. EXAM: CT ANGIOGRAPHY CHEST WITH CONTRAST TECHNIQUE: Multidetector CT imaging of the chest was performed using the standard protocol during bolus administration of intravenous contrast. Multiplanar CT image reconstructions and MIPs were obtained to evaluate the vascular anatomy. CONTRAST:  46mL OMNIPAQUE IOHEXOL 350 MG/ML SOLN COMPARISON:  July 28, 2017 FINDINGS: Cardiovascular: An extensive amount of intraluminal low attenuation is seen throughout the bilateral pulmonary arteries. This extends to involve the bilateral upper lobe, right middle lobe and bilateral lower lobe branches. A large amount of saddle embolus is seen. Normal heart size with right-sided heart strain (RV to LV ratio of 1.56). Flattening of the intraventricular septum is noted.  No pericardial effusion. Mediastinum/Nodes: No enlarged mediastinal, hilar, or axillary lymph nodes. Thyroid gland, trachea, and esophagus demonstrate no significant findings. Lungs/Pleura: Lungs are clear. No pleural effusion or pneumothorax. Upper Abdomen: No acute abnormality. Musculoskeletal: No chest wall abnormality. No acute or significant osseous findings. Review of the MIP images confirms the above findings. IMPRESSION: Massive bilateral pulmonary embolism with saddle embolus and associated right heart strain. Electronically Signed   By: Aram Candela M.D.   On: 08/14/2021 03:12    Labs:  CBC: Recent Labs    08/13/21 2023  WBC 7.0  HGB 17.0  HCT 48.4  PLT 192    COAGS: No results for input(s): INR, APTT in the last 8760 hours.  BMP: Recent Labs    08/13/21 2023  NA 136  K 3.3*  CL 104  CO2 19*  GLUCOSE 101*  BUN 5*  CALCIUM 8.8*  CREATININE 0.90  GFRNONAA >60    LIVER FUNCTION TESTS: No results for input(s): BILITOT, AST, ALT, ALKPHOS, PROT, ALBUMIN in the last 8760 hours.  TUMOR MARKERS: No results for input(s): AFPTM, CEA, CA199, CHROMGRNA in the last  8760 hours.  Assessment and Plan:  Saddle pulmonary embolism: Steve Branch, 30 year old male, is scheduled today for image-guided pulmonary embolism treatment. The procedure was discussed at the bedside in the ICU. Dr. Loreta Ave thoroughly discussed the risks and benefits of mechanical thrombectomy versus catheter-directed thrombolysis and the patient elected for catheter-directed thrombolysis.   Risks and benefits discussed with the patient including, but not limited to bleeding, possible life threatening bleeding and need for blood product transfusion, vascular injury, stroke, contrast induced renal failure, limb loss and infection.  All of the patient's questions were answered, patient is agreeable to proceed.  Consent signed and in IR.   Thank you for this interesting consult.  I greatly enjoyed meeting Steve Branch and look forward to participating in their care.  A copy of this report was sent to the requesting provider on this date.  Electronically Signed: Alwyn Ren, AGACNP-BC [phone] 08/14/2021, 12:17 PM   I spent a total of 40 Minutes    in face to face in clinical consultation, greater than 50% of which was counseling/coordinating care for pulmonary embolism treatment.

## 2021-08-15 ENCOUNTER — Other Ambulatory Visit (HOSPITAL_COMMUNITY): Payer: Self-pay

## 2021-08-15 ENCOUNTER — Inpatient Hospital Stay (HOSPITAL_COMMUNITY): Payer: Self-pay

## 2021-08-15 DIAGNOSIS — I2602 Saddle embolus of pulmonary artery with acute cor pulmonale: Secondary | ICD-10-CM

## 2021-08-15 DIAGNOSIS — I2699 Other pulmonary embolism without acute cor pulmonale: Secondary | ICD-10-CM

## 2021-08-15 HISTORY — PX: IR THROMB F/U EVAL ART/VEN FINAL DAY (MS): IMG5379

## 2021-08-15 LAB — CBC
HCT: 44.7 % (ref 39.0–52.0)
HCT: 45.1 % (ref 39.0–52.0)
HCT: 46.8 % (ref 39.0–52.0)
HCT: 43.5 % (ref 39.0–52.0)
Hemoglobin: 15.5 g/dL (ref 13.0–17.0)
Hemoglobin: 15.7 g/dL (ref 13.0–17.0)
Hemoglobin: 15.8 g/dL (ref 13.0–17.0)
Hemoglobin: 14.7 g/dL (ref 13.0–17.0)
MCH: 34.4 pg — ABNORMAL HIGH (ref 26.0–34.0)
MCH: 34.8 pg — ABNORMAL HIGH (ref 26.0–34.0)
MCH: 34.3 pg — ABNORMAL HIGH (ref 26.0–34.0)
MCH: 33.9 pg (ref 26.0–34.0)
MCHC: 34.4 g/dL (ref 30.0–36.0)
MCHC: 35.1 g/dL (ref 30.0–36.0)
MCHC: 33.8 g/dL (ref 30.0–36.0)
MCHC: 33.8 g/dL (ref 30.0–36.0)
MCV: 100 fL (ref 80.0–100.0)
MCV: 99.1 fL (ref 80.0–100.0)
MCV: 101.5 fL — ABNORMAL HIGH (ref 80.0–100.0)
MCV: 100.5 fL — ABNORMAL HIGH (ref 80.0–100.0)
Platelets: 154 10*3/uL (ref 150–400)
Platelets: 161 10*3/uL (ref 150–400)
Platelets: 167 10*3/uL (ref 150–400)
Platelets: 178 10*3/uL (ref 150–400)
RBC: 4.51 MIL/uL (ref 4.22–5.81)
RBC: 4.51 MIL/uL (ref 4.22–5.81)
RBC: 4.61 MIL/uL (ref 4.22–5.81)
RBC: 4.33 MIL/uL (ref 4.22–5.81)
RDW: 14 % (ref 11.5–15.5)
RDW: 14.1 % (ref 11.5–15.5)
RDW: 13.8 % (ref 11.5–15.5)
RDW: 13.6 % (ref 11.5–15.5)
WBC: 8.6 10*3/uL (ref 4.0–10.5)
WBC: 8.7 10*3/uL (ref 4.0–10.5)
WBC: 8.6 10*3/uL (ref 4.0–10.5)
WBC: 9.1 10*3/uL (ref 4.0–10.5)
nRBC: 0 % (ref 0.0–0.2)
nRBC: 0 % (ref 0.0–0.2)
nRBC: 0 % (ref 0.0–0.2)
nRBC: 0 % (ref 0.0–0.2)

## 2021-08-15 LAB — BASIC METABOLIC PANEL
Anion gap: 11 (ref 5–15)
BUN: 6 mg/dL (ref 6–20)
CO2: 20 mmol/L — ABNORMAL LOW (ref 22–32)
Calcium: 8.8 mg/dL — ABNORMAL LOW (ref 8.9–10.3)
Chloride: 103 mmol/L (ref 98–111)
Creatinine, Ser: 0.89 mg/dL (ref 0.61–1.24)
GFR, Estimated: >60 mL/min (ref >60)
Glucose, Bld: 80 mg/dL (ref 70–99)
Potassium: 4.4 mmol/L (ref 3.5–5.1)
Sodium: 134 mmol/L — ABNORMAL LOW (ref 135–145)

## 2021-08-15 LAB — PHOSPHORUS: Phosphorus: 4 mg/dL (ref 2.5–4.6)

## 2021-08-15 LAB — HEPARIN LEVEL (UNFRACTIONATED)
Heparin Unfractionated: 0.41 IU/mL (ref 0.30–0.70)
Heparin Unfractionated: 0.38 IU/mL (ref 0.30–0.70)

## 2021-08-15 LAB — MAGNESIUM: Magnesium: 1.8 mg/dL (ref 1.7–2.4)

## 2021-08-15 LAB — FIBRINOGEN
Fibrinogen: 449 mg/dL (ref 210–475)
Fibrinogen: 521 mg/dL — ABNORMAL HIGH (ref 210–475)

## 2021-08-15 MED ORDER — THIAMINE HCL 100 MG PO TABS
100.0000 mg | ORAL_TABLET | Freq: Every day | ORAL | Status: DC
  Administered 2021-08-15 – 2021-08-16 (×2): 100 mg via ORAL
  Filled 2021-08-15 (×2): qty 1

## 2021-08-15 MED ORDER — MIDAZOLAM HCL 2 MG/2ML IJ SOLN
INTRAMUSCULAR | Status: AC
  Administered 2021-08-15: 1 mg via INTRAVENOUS
  Filled 2021-08-15: qty 2

## 2021-08-15 MED ORDER — MIDAZOLAM HCL 2 MG/2ML IJ SOLN: 1.0000 mg | Freq: Once | INTRAMUSCULAR | Status: AC

## 2021-08-15 MED ORDER — LIDOCAINE HCL 1 % IJ SOLN
INTRAMUSCULAR | Status: AC | PRN
  Administered 2021-08-15: 7 mL

## 2021-08-15 MED ORDER — FOLIC ACID 1 MG PO TABS
1.0000 mg | ORAL_TABLET | Freq: Every day | ORAL | Status: DC
  Administered 2021-08-15 – 2021-08-16 (×2): 1 mg via ORAL
  Filled 2021-08-15 (×2): qty 1

## 2021-08-15 MED ORDER — LIDOCAINE HCL 1 % IJ SOLN
INTRAMUSCULAR | Status: AC
  Filled 2021-08-15: qty 20

## 2021-08-15 MED ORDER — APIXABAN (ELIQUIS) VTE STARTER PACK (10MG AND 5MG)
ORAL_TABLET | ORAL | 0 refills | Status: DC
  Filled 2021-08-15: qty 74, 30d supply, fill #0

## 2021-08-15 NOTE — Progress Notes (Signed)
Interventional Radiology Progress Note  30 yo male with submassive PE returns this am to VIR for check and completion.   Overnight VIR called with complaint of "hemoptysis"/coughing up blood.  This prompted a change in the rate of lysis at about 10pm.  We changed the 1mg /hr bilateral to 0.5mg /hr bilateral.  Completion at about 2:30am.   Patient remained stable.   He tells me this am that he felt this was most likely from his gums.    On exam, there is some clot at the gumline of his mandibular and maxillary premolar teeth, confirming this was from gingivitis.    Fibrinogen remain >200  Plan to proceed this am with pressure check and removal of catheters.   Signed,  . Yvone Neu, DO

## 2021-08-15 NOTE — Procedures (Signed)
Interventional Radiology Procedure Note  Procedure:   Lysis completion, with pressure measurement.   Findings: Completion 43/8 (19) Pre: Main PA pressure: 63/18 (35) .  Complications: None  Recommendations:  - right hip straight x 1 hr. - dressing x 24 hours - may ambulate per primary - diet OK per primary order  Signed,  Yvone Neu. Loreta Ave, DO

## 2021-08-15 NOTE — Discharge Instructions (Signed)
Do not take NSAIDs due to increased bleed risk.

## 2021-08-15 NOTE — Plan of Care (Signed)

## 2021-08-15 NOTE — Progress Notes (Signed)
Lower extremity venous has been completed.   Preliminary results in CV Proc.   Blanch Media 08/15/2021 1:42 PM

## 2021-08-15 NOTE — Progress Notes (Addendum)
NAMEPascual Branch, MRN:  [id number], DOB:  Jun 11, 1991, LOS: 1 ADMISSION DATE:  08/13/2021, CONSULTATION DATE: 08/14/2021 REFERRING MD: Rancour, CHIEF COMPLAINT: Pulmonary embolism  History of Present Illness:  This is a 30 year old black male that presented to the emergency room with increasing shortness of breath.  Patient states that approximately 2 weeks ago he started having left lower extremity edema and moderate discomfort.  He noted that this acutely improved at which time he became very dyspneic on exertion which progressed to shortness of breath at rest.  He had had a dull substernal chest discomfort that started about the same time as the shortness of breath that started.  Patient has had several episodes of near syncope her last several days.  On presentation emergency room patient had a CAT scan that demonstrated saddle pulmonary embolism.  Per the reading of the CAT scan there was RV strain.  Emergent echocardiogram was performed at bedside that demonstrated RV strain with intact LV function.  PE risk factors: Patient lives a sedentary lifestyle.  Active smoker There is a history of sudden cardiac death in 2 of his uncles unknown if there pulmonary embolism related.  No miscarriages noted in the family.  Social history:  Drinks extensive alcohol on a daily basis.  Patient was unable to quantify the amount. Patient smokes 1 to 2 packs/day of cigarettes. No significant IVDA history. Pertinent  Medical History  Migraine headaches TBI as a teenager  Significant Hospital Events: Including procedures, antibiotic start and stop dates in addition to other pertinent events   8/18 S/p catheter directed PE lysis   Subjective  Denies chest pain or shortness of breath. Will feel palpitations when he moves more. Had some bleeding related to his teeth yesterday but no further episodes this morning except for old blood  Objective   Blood pressure 114/77, pulse 75, temperature 98.3 F  (36.8 C), temperature source Oral, resp. rate 19, height 5\' 10"  (1.778 m), weight 113 kg, SpO2 97 %.        Intake/Output Summary (Last 24 hours) at 08/15/2021 0724 Last data filed at 08/15/2021 0600 Gross per 24 hour  Intake 1492.46 ml  Output 950 ml  Net 542.46 ml   Filed Weights   08/14/21 0745 08/15/21 0400  Weight: 106.6 kg 113 kg    Physical Exam: General: Well-appearing, no acute distress HENT: [place], AT, OP clear, MMM, right third molar with clotted blood, no active bleeding noted Eyes: EOMI, no scleral icterus Respiratory: Clear to auscultation bilaterally.  No crackles, wheezing or rales Cardiovascular: RRR, -M/R/G, no JVD GI: BS+, soft, nontender Extremities: Bilateral femoral sheaths in place, mild tenderness at insertion site, no induration or swelling Neuro: AAO x4, CNII-XII grossly intact Skin: Intact, no rashes or bruising Psych: Normal mood, normal affect   Assessment & Plan:   Saddle pulmonary embolism with right heart strain s/p catheter directed lytics Echocardiogram with global hypokinesis and evidence of increased RV pressure. EF 45-50% On RA. Hemodynamically stable. On anticoagulation --tPA followed by heparin gtt. IR following with plan to remove sheath when therapy completed --Trend H/H --Lower extremity Dopplers ordered --NPO  EtOH abuse Tobacco abuse --Monitor closely for alcohol withdrawal.  Patient states that he can go days without alcohol without any adverse effects. --Discontinue phenobarb --Thiamine, folic acid, multivitamin --Discussed smoking cessation  Best Practice (right click and "Reselect all SmartList Selections" daily)   Diet/type: NPO DVT prophylaxis: systemic heparin GI prophylaxis: PPI Lines: N/A Foley:  N/A Code Status:  full  code   Critical care time: 40 mins    The patient is critically ill with saddle pulmonary embolism and requires high complexity decision making for assessment and support, frequent evaluation  and titration of therapies, application of advanced monitoring technologies and extensive interpretation of multiple databases.   Mechele Collin, M.D. [facility] Pulmonary/Critical Care Medicine 08/15/2021 7:24 AM   Please see Amion for pager number to reach on-call Pulmonary and Critical Care Team.

## 2021-08-15 NOTE — Progress Notes (Signed)
Echo showed mild LV dysfunction; also with RVE and RV dysfunction related to pulmonary embolus; no plans for further inpatient cardiac evaluation; would repeat echo in 3 months to see if LV function has improved; if not will consider further evaluation. We will arrange fu with me in 3 months. Cardiology will sign off; please call with questions. Olga Millers

## 2021-08-15 NOTE — Progress Notes (Signed)
ANTICOAGULATION CONSULT NOTE - Follow Up Consult  Pharmacy Consult for Heparin Indication: pulmonary embolus  No Known Allergies  Patient Measurements: Height: 5\' 10"  (177.8 cm) Weight: 113 kg (249 lb 1.9 oz) IBW/kg (Calculated) : 73 Heparin Dosing Weight: 95.9 kg  Vital Signs: Temp: 98.8 F (37.1 C) (08/19 1113) Temp Source: Oral (08/19 1113) BP: 121/77 (08/19 1230) Pulse Rate: 78 (08/19 1230)  Labs: Recent Labs    08/13/21 2023 08/13/21 2218 08/14/21 1111 08/14/21 1516 08/14/21 2107 08/15/21 0309 08/15/21 0951  HGB 17.0  --   --    < > 16.2 15.7  15.5 15.8  HCT 48.4  --   --    < > 47.3 44.7  45.1 46.8  PLT 192  --   --    < > 177 154  161 167  HEPARINUNFRC  --   --  0.50   < > 0.42 0.41 0.38  CREATININE 0.90  --   --   --   --  0.89  --   TROPONINIHS 53* 102* 44*  --   --   --   --    < > = values in this interval not displayed.     Estimated Creatinine Clearance: 152.8 mL/min (by C-G formula based on SCr of 0.89 mg/dL).  Assessment: 30 yo M with saddle pulmonary embolism s/p catheter-directed thrombolysis 8/18. Pharmacy consulted for heparin.  Heparin was not stopped during thrombolysis. HL 0.38 is therapeutic. Will increase heparin since thrombolytics are no longer being given. Overnight, patient complained of gingival bleeding, however this issue is now resolved. Will plan on continuing therapeutic heparin and transition to oral anticoagulation on 8/19  Goal of Therapy:  Heparin level 0.3-0.7 units/ml Monitor platelets by anticoagulation protocol: Yes   Plan:  Increase heparin to 1500 units/hour  Monitor daily HL, CBC/plt Monitor for signs/symptoms of bleeding    Thank you for allowing pharmacy to participate in this patient's care.  9/19, PharmD PGY1 Pharmacy Resident 08/15/2021 1:02 PM Check [email or web address] for unit specific pharmacy number

## 2021-08-15 NOTE — TOC Progression Note (Signed)
Transition of Care [facility]) - Progression Note    Patient Details  Name: Steve Branch MRN: [id number] Date of Birth: 19-Nov-1991  [facility] CM/SW Contact  Lockie Pares, RN Phone Number: 08/15/2021, 1:23 PM  Clinical Narrative:     MATCH done for patient ass he is uninsured, will be on DOAC. Messaged MD about DC medications, preferably to [facility] pharmacy, if DC weekend, they could be sent to [facility] for keeping in main pharmacy He will need a transition  to DOAC of 24 hours. Still having some hemoptysis.   Expected Discharge Plan: Home/Self Care Barriers to Discharge: Continued Medical Work up  Expected Discharge Plan and Services Expected Discharge Plan: Home/Self Care   Discharge Planning Services: CM Consult   Living arrangements for the past 2 months: Single Family Home                                       Social Determinants of Health (SDOH) Interventions    Readmission Risk Interventions No flowsheet data found.

## 2021-08-16 LAB — CBC
HCT: 42.9 % (ref 39.0–52.0)
Hemoglobin: 14.9 g/dL (ref 13.0–17.0)
MCH: 34.3 pg — ABNORMAL HIGH (ref 26.0–34.0)
MCHC: 34.7 g/dL (ref 30.0–36.0)
MCV: 98.8 fL (ref 80.0–100.0)
Platelets: 180 10*3/uL (ref 150–400)
RBC: 4.34 MIL/uL (ref 4.22–5.81)
RDW: 13.6 % (ref 11.5–15.5)
WBC: 7.4 10*3/uL (ref 4.0–10.5)
nRBC: 0 % (ref 0.0–0.2)

## 2021-08-16 LAB — HEPARIN LEVEL (UNFRACTIONATED): Heparin Unfractionated: 0.3 IU/mL (ref 0.30–0.70)

## 2021-08-16 MED ORDER — APIXABAN 5 MG PO TABS
10.0000 mg | ORAL_TABLET | Freq: Two times a day (BID) | ORAL | Status: DC
  Administered 2021-08-16: 10 mg via ORAL
  Filled 2021-08-16: qty 2

## 2021-08-16 NOTE — Discharge Summary (Signed)
Physician Discharge Summary  [name] MRN: [id number] DOB/AGE: 1991-10-05 30 y.o.  Admit date: 08/13/2021 Discharge date: 08/16/2021  Admission Diagnoses:  Discharge Diagnoses:  Active Problems:   Acute saddle pulmonary embolism [facility])   Discharged Condition: good  Hospital Course:  30 year old black male that presented to the emergency room with increasing shortness of breath.  Patient states that approximately 2 weeks ago he started having left lower extremity edema and moderate discomfort.  He noted that this acutely improved at which time he became very dyspneic on exertion which progressed to shortness of breath at rest.  He had had a dull substernal chest discomfort that started about the same time as the shortness of breath that started.  Patient has had several episodes of near syncope her last several days.  On presentation emergency room patient had a CAT scan that demonstrated saddle pulmonary embolism.  Per the reading of the CAT scan there was RV strain.  Emergent echocardiogram was performed at bedside that demonstrated RV strain with intact LV function.   PE risk factors: Patient lives a sedentary lifestyle.  Active smoker There is a history of sudden cardiac death in 2 of his uncles unknown if there pulmonary embolism related.  No miscarriages noted in the family.  Saddle pulmonary embolism with right heart strain s/p catheter directed lytics Echocardiogram with global hypokinesis and evidence of increased RV pressure. EF 45-50% On room air. Hemodynamically stable. On anticoagulation. No further episodes of bleeding from mouth --Treated with tPA followed by heparin gtt. Transition to eliquis today. One month discount card given --Will arrange pulmonary follow-up with me (Dr. Everardo All)   EtOH abuse Tobacco abuse --Smoking cessation  Consults: cardiology and pulmonary/intensive care  Significant Diagnostic Studies: angiography:   CTA  08/14/21  CLINICAL DATA:  Shortness of breath.   EXAM: CT ANGIOGRAPHY CHEST WITH CONTRAST   TECHNIQUE: Multidetector CT imaging of the chest was performed using the standard protocol during bolus administration of intravenous contrast. Multiplanar CT image reconstructions and MIPs were obtained to evaluate the vascular anatomy.   CONTRAST:  106mL OMNIPAQUE IOHEXOL 350 MG/ML SOLN   COMPARISON:  July 28, 2017   FINDINGS: Cardiovascular: An extensive amount of intraluminal low attenuation is seen throughout the bilateral pulmonary arteries. This extends to involve the bilateral upper lobe, right middle lobe and bilateral lower lobe branches. A large amount of saddle embolus is seen. Normal heart size with right-sided heart strain (RV to LV ratio of 1.56). Flattening of the intraventricular septum is noted. No pericardial effusion.   Mediastinum/Nodes: No enlarged mediastinal, hilar, or axillary lymph nodes. Thyroid gland, trachea, and esophagus demonstrate no significant findings.   Lungs/Pleura: Lungs are clear. No pleural effusion or pneumothorax.   Upper Abdomen: No acute abnormality.   Musculoskeletal: No chest wall abnormality. No acute or significant osseous findings.   Review of the MIP images confirms the above findings.   IMPRESSION: Massive bilateral pulmonary embolism with saddle embolus and associated right heart strain.    Treatments: anticoagulation: catheter directed thrombolytic, heparin and eliquis  Discharge Exam: Blood pressure 113/71, pulse 62, temperature 99 F (37.2 C), temperature source Oral, resp. rate 16, height 5\' 9"  (1.753 m), weight 108.4 kg, SpO2 99 %.  Physical Exam: General: Well-appearing, no acute distress HENT: [name], AT, OP clear, MMM Eyes: EOMI, no scleral icterus Respiratory: Clear to auscultation bilaterally.  No crackles, wheezing or rales Cardiovascular: RRR, -M/R/G, no JVD Extremities:-Edema,-tenderness Neuro: AAO x4,  CNII-XII grossly intact Skin: Intact, no rashes  or bruising Psych: Normal mood, normal affect  Disposition:    Allergies as of 08/16/2021   No Known Allergies      Medication List     STOP taking these medications    amoxicillin-clavulanate 1000-62.5 MG 12 hr tablet Commonly known as: Augmentin XR   clobetasol ointment 0.05 % Commonly known as: TEMOVATE   cyclobenzaprine 10 MG tablet Commonly known as: FLEXERIL   diclofenac 50 MG EC tablet Commonly known as: VOLTAREN   doxycycline 100 MG capsule Commonly known as: VIBRAMYCIN   ibuprofen 200 MG tablet Commonly known as: ADVIL   meloxicam 15 MG tablet Commonly known as: Mobic   naproxen 500 MG tablet Commonly known as: NAPROSYN       TAKE these medications    Eliquis DVT/PE Starter Pack Generic drug: Apixaban Starter Pack (10mg  and 5mg ) Take as directed on package: start with two-5mg  tablets twice daily for 7 days. On day 8, switch to one-5mg  tablet twice daily.         Signed: 08/16/2021, 12:23 PM

## 2021-08-18 ENCOUNTER — Telehealth: Payer: Self-pay

## 2021-08-18 NOTE — Telephone Encounter (Signed)
FYI appointment scheduled for September 14th at 4:00 pm (15 minute slot).

## 2021-08-21 ENCOUNTER — Telehealth (HOSPITAL_COMMUNITY): Payer: Self-pay

## 2021-08-21 ENCOUNTER — Other Ambulatory Visit (HOSPITAL_COMMUNITY): Payer: Self-pay

## 2021-08-21 NOTE — Telephone Encounter (Signed)
Transitions of Care Pharmacy   Call attempted for a pharmacy transitions of care follow-up. Unable to leave voicemail.  Call attempt #1. Will follow-up in 2-3 days.    

## 2021-08-22 ENCOUNTER — Telehealth (HOSPITAL_COMMUNITY): Payer: Self-pay

## 2021-08-22 NOTE — Telephone Encounter (Signed)
Pharmacy Transitions of Care Follow-up Telephone Call  Date of discharge: 08/16/21  Discharge Diagnosis: PE  How have you been since you were released from the hospital? Patient has been doing well since discharge. Wanted to know if he could drink alcohol while on Eliquis. Was advised to not drink heavily but he could have an occasional beer if he wants one.   Medication changes made at discharge:      START taking: Eliquis DVT/PE Starter Pack (Apixaban Starter Pack (10mg  and 5mg ))  STOP taking: amoxicillin-clavulanate 1000-62.5 MG 12 hr tablet (Augmentin XR)  clobetasol ointment 0.05 % (TEMOVATE)  cyclobenzaprine 10 MG tablet (FLEXERIL)  diclofenac 50 MG EC tablet (VOLTAREN)  doxycycline 100 MG capsule (VIBRAMYCIN)  ibuprofen 200 MG tablet (ADVIL)  meloxicam 15 MG tablet (Mobic)  naproxen 500 MG tablet (NAPROSYN)   Medication changes verified by the patient? Yes    Medication Accessibility:  Home Pharmacy:  not discussed  Was the patient provided with refills on discharged medications? No   Have all prescriptions been transferred from [facility] to home pharmacy? N/A   Is the patient able to afford medications? No insurance, will need patient assistance. May need to get set up with Im program. Patient will call to set up appointment.    Medication Review:  APIXABAN (ELIQUIS)  Apixaban 10 mg BID initiated on 08/15/21. Will switch to apixaban 5 mg BID after 7 days (DATE 08/22/21).  - Discussed importance of taking medication around the same time everyday  - Advised patient of medications to avoid (NSAIDs, ASA)  - Educated that Tylenol (acetaminophen) will be the preferred analgesic to prevent risk of bleeding  - Emphasized importance of monitoring for signs and symptoms of bleeding (abnormal bruising, prolonged bleeding, nose bleeds, bleeding from gums, discolored urine, black tarry stools)  - Advised patient to alert all providers of anticoagulation therapy prior to starting a new  medication or having a procedure   Follow-up Appointments:  PCP Hospital f/u appt confirmed? Patient calling to set up appointment.   Specialist Hospital f/u appt confirmed? Scheduled to see Dr. 08/17/21 on 09/10/21 @ Pulmonology.   If their condition worsens, is the pt aware to call PCP or go to the Emergency Dept.? Yes  Final Patient Assessment: Patient knows to call to set up appointment for follow up and that he will need refills for maintenance dose

## 2021-09-10 ENCOUNTER — Ambulatory Visit: Payer: Self-pay | Admitting: Pulmonary Disease

## 2021-09-10 ENCOUNTER — Telehealth: Payer: Self-pay

## 2021-09-10 NOTE — Progress Notes (Deleted)
    Subjective:   [name] GENDER: male DOB: 1991/09/30, MRN: [id number]   HPI  No chief complaint on file.   Reason for Visit: Follow-up hospitalization  Mr. Steve Branch is a 30 year old male active smoker who presents as hospital follow-up for saddle pulmonary embolism.  He was admitted from 08/14/2021 to 08/16/2021.  While inpatient his echocardiogram demonstrated global hypokinesis and increased RV pressure.  EF 45 to 50%.  He was treated with tPA followed by heparin and then transition to Eliquis.  Prior to discharge he was on room air and hemodynamically stable.  Since hospital discharge he has been compliant with his anticoagulation.   He has a history of sudden cardiac death (two uncles), unknown if PE was identified. Reports sedentary lifestyle.   Social History: Active smoker  I have personally reviewed patient's past medical/family/social history, allergies, current medications.  Past Medical History:  Diagnosis Date   Migraine      No family history on file.   Social History   Occupational History   Not on file  Tobacco Use   Smoking status: Every Day    Packs/day: 1.00    Types: Cigarettes   Smokeless tobacco: Never  Vaping Use   Vaping Use: Never used  Substance and Sexual Activity   Alcohol use: Yes    Comment: ocaasional.   Drug use: No   Sexual activity: Not on file    No Known Allergies   Outpatient Medications Prior to Visit  Medication Sig Dispense Refill   APIXABAN (ELIQUIS) VTE STARTER PACK (10MG  AND 5MG ) Take as directed on package: start with two-5mg  tablets twice daily for 7 days. On day 8, switch to one-5mg  tablet twice daily. 74 each 0   No facility-administered medications prior to visit.    ROS   Objective:  There were no vitals filed for this visit.    Physical Exam: General: Well-appearing, no acute distress HENT: [place], AT Eyes: EOMI, no scleral icterus Respiratory: Clear to auscultation  bilaterally.  No crackles, wheezing or rales Cardiovascular: RRR, -M/R/G, no JVD Extremities:-Edema,-tenderness Neuro: AAO x4, CNII-XII grossly intact Psych: Normal mood, normal affect  Data Reviewed:  Imaging: CTA 08/14/21 - Massive bilateral pulmonary embolism with saddle embolus and associated right heart strain.  PFT: None on file  Labs:      Assessment & Plan:   Discussion: 30 year old male active smoker with saddle pulmonary embolism who presents for hospital follow-up.  Pulmonary embolism with LV/RV dyfunction --Continue Eliquis --Refer to Hematology for hypercoagulable work-up --Cardiology follow-up scheduled for 10/22/21  Health Maintenance  There is no immunization history on file for this patient. CT Lung Screen***  No orders of the defined types were placed in this encounter. No orders of the defined types were placed in this encounter.   No follow-ups on file.  I have spent a total time of***-minutes on the day of the appointment reviewing prior documentation, coordinating care and discussing medical diagnosis and plan with the patient/family. Imaging, labs and tests included in this note have been reviewed and interpreted independently by me.  Steve Branch 25, MD [facility] Critical Care 09/10/2021 3:47 PM  Office Number [phone]

## 2021-09-10 NOTE — Telephone Encounter (Signed)
Called Steve Branch on both cell and home phone since he missed his appt with Dr. Everardo All. Voice mail was full and no answer on home phone. Per JE he can see NP needs earliest appt

## 2021-10-22 ENCOUNTER — Ambulatory Visit: Payer: Self-pay | Admitting: Medical

## 2021-10-22 NOTE — Progress Notes (Deleted)
Cardiology Office Note   Date:  10/22/2021   ID:  Steve Branch, DOB October 22, 1991, MRN [id number]  PCP:  Patient, No Pcp Per (Inactive)  Cardiologist:  Olga Millers, MD EP: None  No chief complaint on file.     History of Present Illness: Steve Branch is a 30 y.o. male with a PMH of PE (07/2021), chronic combined CHF, tobacco abuse, and ETOH abuse who presents for hospital follow-up.  He was last evaluated by cardiology during an admission to the hospital 07/2021 for saddle PE after presenting with chest pain and shortness of breath.  Echocardiogram that admission showed EF 45-50%, global hypokinesis, normal LV diastolic function, findings consistent with right ventricular pressure and volume overload, moderately reduced RV systolic function, severe RV enlargement, mildly elevated PA pressures, and no significant valvular abnormalities.  LV/RV dysfunction was felt to be secondary to PE.  He was recommended to repeat an echocardiogram in 3 months to reevaluate LV function, at which time further testing can be considered if no improvement.  He was discharged home on Eliquis.  He presents today for hospital follow-up.  1.  Chronic combined CHF in the setting of PE:  EF mildly reduced to 45-50% with moderate RVSF in the setting of saddle PE.  - Will plan to repeat an echocardiogram in 1 month to evaluate for improvement in EF - if EF remains low, will consider ischemic evaluation with a coronary CTA   2. PE: felt to be 2/2 sedentary lifestyle and tobacco use, though does have family history of sudden death in 2 uncles - unclear if there is a coagulopathy disorder. He has failed to follow-up with pulmonology since discharge.  - Continue eliquis - Encouraged follow-up with pulm    Past Medical History:  Diagnosis Date   Migraine     Past Surgical History:  Procedure Laterality Date   IR ANGIOGRAM FOLLOW UP STUDY  08/14/2021   IR ANGIOGRAM FOLLOW UP STUDY  08/14/2021   IR  ANGIOGRAM PULMONARY BILATERAL SELECTIVE  08/14/2021   IR ANGIOGRAM SELECTIVE EACH ADDITIONAL VESSEL  08/14/2021   IR ANGIOGRAM SELECTIVE EACH ADDITIONAL VESSEL  08/14/2021   IR INFUSION THROMBOL ARTERIAL INITIAL (MS)  08/14/2021   IR INFUSION THROMBOL ARTERIAL INITIAL (MS)  08/14/2021   IR THROMB F/U EVAL ART/VEN FINAL DAY (MS)  08/14/2021   IR THROMB F/U EVAL ART/VEN FINAL DAY (MS)  08/15/2021     Current Outpatient Medications  Medication Sig Dispense Refill   APIXABAN (ELIQUIS) VTE STARTER PACK (10MG  AND 5MG ) Take as directed on package: start with two-5mg  tablets twice daily for 7 days. On day 8, switch to one-5mg  tablet twice daily. 74 each 0   No current facility-administered medications for this visit.    Allergies:   Patient has no known allergies.    Social History:  The patient  reports that he has been smoking cigarettes. He has been smoking an average of 1 pack per day. He has never used smokeless tobacco. He reports current alcohol use. He reports that he does not use drugs.   Family History:  The patient's ***family history is not on file.    ROS:  Please see the history of present illness.   Otherwise, review of systems are positive for {NONE DEFAULTED:18576}.   All other systems are reviewed and negative.    PHYSICAL EXAM: VS:  There were no vitals taken for this visit. , BMI There is no height or weight on file to calculate BMI. GEN:  Well nourished, well developed, in no acute distress HEENT: normal Neck: no JVD, carotid bruits, or masses Cardiac: ***RRR; no murmurs, rubs, or gallops,no edema  Respiratory:  clear to auscultation bilaterally, normal work of breathing GI: soft, nontender, nondistended, + BS MS: no deformity or atrophy Skin: warm and dry, no rash Neuro:  Strength and sensation are intact Psych: euthymic mood, full affect   EKG:  EKG {ACTION; IS/IS UUV:[id number]} ordered today. The ekg ordered today demonstrates ***   Recent Labs: 08/15/2021: BUN 6;  Creatinine, Ser 0.89; Magnesium 1.8; Potassium 4.4; Sodium 134 08/16/2021: Hemoglobin 14.9; Platelets 180    Lipid Panel    Component Value Date/Time   CHOL 141 08/14/2021 1111   TRIG 80 08/14/2021 1111   HDL 68 08/14/2021 1111   CHOLHDL 2.1 08/14/2021 1111   VLDL 16 08/14/2021 1111   LDLCALC 57 08/14/2021 1111      Wt Readings from Last 3 Encounters:  08/15/21 238 lb 15.7 oz (108.4 kg)  09/07/17 235 lb (106.6 kg)  12/20/16 240 lb (108.9 kg)      Other studies Reviewed: Additional studies/ records that were reviewed today include:   Echocardiogram 07/2021: 1. Left ventricular ejection fraction, by estimation, is 45 to 50%. The  left ventricle has mildly decreased function. The left ventricle  demonstrates global hypokinesis. Left ventricular diastolic parameters  were normal. There is the interventricular  septum is flattened in systole and diastole, consistent with right  ventricular pressure and volume overload.   2. Right ventricular systolic function is moderately reduced. The right  ventricular size is severely enlarged. There is mildly elevated pulmonary  artery systolic pressure.   3. The mitral valve is normal in structure. No evidence of mitral valve  regurgitation. No evidence of mitral stenosis.   4. The aortic valve was not well visualized. Aortic valve regurgitation  is not visualized. No aortic stenosis is present.   5. The inferior vena cava is dilated in size with <50% respiratory  variability, suggesting right atrial pressure of 15 mmHg.     ASSESSMENT AND PLAN:  1.  ***   Current medicines are reviewed at length with the patient today.  The patient {ACTIONS; HAS/DOES NOT HAVE:19233} concerns regarding medicines.  The following changes have been made:  {PLAN; NO CHANGE:13088:s}  Labs/ tests ordered today include: *** No orders of the defined types were placed in this encounter.    Disposition:   FU with *** in {gen number 3-47:425956} {Days to  years:10300}  Signed, Beatriz Stallion, PA-C  10/22/2021 8:06 AM

## 2021-10-29 ENCOUNTER — Ambulatory Visit: Payer: Self-pay | Admitting: Internal Medicine

## 2021-10-29 NOTE — Progress Notes (Deleted)
Cardiology Office Note:    Date:  10/29/2021   ID:  Steve Branch, DOB 07/27/1991, MRN [id number]  PCP:  Patient, No Pcp Per (Inactive)   [facility] Providers Cardiologist:  Olga Millers, MD { Click to update primary MD,subspecialty MD or APP then REFRESH:1}    Referring MD: No ref. provider found   No chief complaint on file. ***  History of Present Illness:    Steve Branch is a 30 y.o. male with a hx of migraine, saddle PE  s/p EKOS by pulm on eliquis 07/2021, smoker, referral for     Echo: EF 45-50%, flattened septum c/w RV pressure overload  Family Hx: 2 uncles with sudden cardiac death  Past Medical History:  Diagnosis Date   Migraine     Past Surgical History:  Procedure Laterality Date   IR ANGIOGRAM FOLLOW UP STUDY  08/14/2021   IR ANGIOGRAM FOLLOW UP STUDY  08/14/2021   IR ANGIOGRAM PULMONARY BILATERAL SELECTIVE  08/14/2021   IR ANGIOGRAM SELECTIVE EACH ADDITIONAL VESSEL  08/14/2021   IR ANGIOGRAM SELECTIVE EACH ADDITIONAL VESSEL  08/14/2021   IR INFUSION THROMBOL ARTERIAL INITIAL (MS)  08/14/2021   IR INFUSION THROMBOL ARTERIAL INITIAL (MS)  08/14/2021   IR THROMB F/U EVAL ART/VEN FINAL DAY (MS)  08/14/2021   IR THROMB F/U EVAL ART/VEN FINAL DAY (MS)  08/15/2021    Current Medications: No outpatient medications have been marked as taking for the 10/29/21 encounter (Appointment) with Maisie Fus, MD.     Allergies:   Patient has no known allergies.   Social History   Socioeconomic History   Marital status: Single    Spouse name: Not on file   Number of children: Not on file   Years of education: Not on file   Highest education level: Not on file  Occupational History   Not on file  Tobacco Use   Smoking status: Every Day    Packs/day: 1.00    Types: Cigarettes   Smokeless tobacco: Never  Vaping Use   Vaping Use: Never used  Substance and Sexual Activity   Alcohol use: Yes    Comment: ocaasional.   Drug use: No   Sexual activity: Not  on file  Other Topics Concern   Not on file  Social History Narrative   Not on file   Social Determinants of Health   Financial Resource Strain: Not on file  Food Insecurity: Not on file  Transportation Needs: Not on file  Physical Activity: Not on file  Stress: Not on file  Social Connections: Not on file     Family History: The patient's ***family history is not on file.  ROS:   Please see the history of present illness.    *** All other systems reviewed and are negative.  EKGs/Labs/Other Studies Reviewed:    The following studies were reviewed today: ***  EKG:  EKG is *** ordered today.  The ekg ordered today demonstrates ***  Recent Labs: 08/15/2021: BUN 6; Creatinine, Ser 0.89; Magnesium 1.8; Potassium 4.4; Sodium 134 08/16/2021: Hemoglobin 14.9; Platelets 180  Recent Lipid Panel    Component Value Date/Time   CHOL 141 08/14/2021 1111   TRIG 80 08/14/2021 1111   HDL 68 08/14/2021 1111   CHOLHDL 2.1 08/14/2021 1111   VLDL 16 08/14/2021 1111   LDLCALC 57 08/14/2021 1111     Risk Assessment/Calculations:   {Does this patient have ATRIAL FIBRILLATION?[phone]}       Physical Exam:    VS:  There were no vitals taken for this visit.    Wt Readings from Last 3 Encounters:  08/15/21 238 lb 15.7 oz (108.4 kg)  09/07/17 235 lb (106.6 kg)  12/20/16 240 lb (108.9 kg)     GEN: *** Well nourished, well developed in no acute distress HEENT: Normal NECK: No JVD; No carotid bruits LYMPHATICS: No lymphadenopathy CARDIAC: ***RRR, no murmurs, rubs, gallops RESPIRATORY:  Clear to auscultation without rales, wheezing or rhonchi  ABDOMEN: Soft, non-tender, non-distended MUSCULOSKELETAL:  No edema; No deformity  SKIN: Warm and dry NEUROLOGIC:  Alert and oriented x 3 PSYCHIATRIC:  Normal affect   ASSESSMENT:    No diagnosis found. PLAN:    In order of problems listed above:  ***   {Are you ordering a CV Procedure (e.g. stress test, cath, DCCV, TEE, etc)?    Press F2        :[id number]}    Medication Adjustments/Labs and Tests Ordered: Current medicines are reviewed at length with the patient today.  Concerns regarding medicines are outlined above.  No orders of the defined types were placed in this encounter.  No orders of the defined types were placed in this encounter.   There are no Patient Instructions on file for this visit.   Signed, Maisie Fus, MD  10/29/2021 7:10 AM    [facility]

## 2021-12-14 ENCOUNTER — Ambulatory Visit (HOSPITAL_COMMUNITY)
Admission: EM | Admit: 2021-12-14 | Discharge: 2021-12-14 | Disposition: A | Discharge status: Still In Hospital | Payer: Self-pay

## 2021-12-14 ENCOUNTER — Other Ambulatory Visit: Payer: Self-pay

## 2021-12-14 ENCOUNTER — Emergency Department (HOSPITAL_BASED_OUTPATIENT_CLINIC_OR_DEPARTMENT_OTHER): Payer: Self-pay

## 2021-12-14 ENCOUNTER — Encounter (HOSPITAL_COMMUNITY): Payer: Self-pay | Admitting: Oncology

## 2021-12-14 ENCOUNTER — Emergency Department (HOSPITAL_COMMUNITY)
Admission: EM | Admit: 2021-12-14 | Discharge: 2021-12-14 | Disposition: A | Discharge status: Home / Self Care | Payer: Self-pay | Attending: Emergency Medicine | Admitting: Emergency Medicine

## 2021-12-14 DIAGNOSIS — R252 Cramp and spasm: Secondary | ICD-10-CM

## 2021-12-14 DIAGNOSIS — M79662 Pain in left lower leg: Secondary | ICD-10-CM | POA: Insufficient documentation

## 2021-12-14 DIAGNOSIS — R0981 Nasal congestion: Secondary | ICD-10-CM | POA: Insufficient documentation

## 2021-12-14 DIAGNOSIS — F17210 Nicotine dependence, cigarettes, uncomplicated: Secondary | ICD-10-CM | POA: Insufficient documentation

## 2021-12-14 DIAGNOSIS — Z760 Encounter for issue of repeat prescription: Secondary | ICD-10-CM | POA: Insufficient documentation

## 2021-12-14 DIAGNOSIS — Z20822 Contact with and (suspected) exposure to covid-19: Secondary | ICD-10-CM | POA: Insufficient documentation

## 2021-12-14 LAB — RESP PANEL BY RT-PCR (FLU A&B, COVID) ARPGX2
Influenza A by PCR: NEGATIVE
Influenza B by PCR: NEGATIVE
SARS Coronavirus 2 by RT PCR: NEGATIVE

## 2021-12-14 MED ORDER — FLUTICASONE PROPIONATE 50 MCG/ACT NA SUSP: 2.0000 | Freq: Every day | NASAL | 0 refills | Status: AC

## 2021-12-14 MED ORDER — AMOXICILLIN-POT CLAVULANATE 875-125 MG PO TABS
1.0000 | ORAL_TABLET | Freq: Once | ORAL | Status: AC
  Administered 2021-12-14: 20:00:00 1 via ORAL
  Filled 2021-12-14: qty 1

## 2021-12-14 MED ORDER — AMOXICILLIN-POT CLAVULANATE 875-125 MG PO TABS: 1.0000 | ORAL_TABLET | Freq: Two times a day (BID) | ORAL | 0 refills | Status: AC

## 2021-12-14 MED ORDER — APIXABAN 5 MG PO TABS
5.0000 mg | ORAL_TABLET | Freq: Once | ORAL | Status: AC
  Administered 2021-12-14: 21:00:00 5 mg via ORAL
  Filled 2021-12-14: qty 1

## 2021-12-14 MED ORDER — APIXABAN 5 MG PO TABS: 5.0000 mg | ORAL_TABLET | Freq: Two times a day (BID) | ORAL | 0 refills | Status: DC

## 2021-12-14 NOTE — ED Provider Notes (Signed)
[facility]-EMERGENCY DEPT Provider Note   CSN: [id number] Arrival date & time: 12/14/21  1540     History Chief Complaint  Patient presents with   Leg Pain    Steve Branch is a 30 y.o. male.  HPI  30 year old male with a history of migraines, PE, who presents to the emergency department today with multiple complaints.  Today he is complaining of rhinorrhea, congestion, sinus pressure and intermittent headaches that have been ongoing for about 2 weeks.  He has had intermittent sweats and chills but no documented fevers at home.  Does not report any cough or sore throat.   Additionally he complains of intermittent cramping to his left lower extremity.  He is concerned he may have a blood clot.  He reports that since he was discharged from the hospital in August after being diagnosed with a saddle PE that he did not get any refills of his anticoagulation or follow-up with PCP due to not having insurance.  He denies any chest pain or shortness of breath.  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   FIBRILLATION, ATRIAL 03/20/2010    Past Surgical History:  Procedure Laterality Date   IR ANGIOGRAM FOLLOW UP STUDY  08/14/2021   IR ANGIOGRAM FOLLOW UP STUDY  08/14/2021   IR ANGIOGRAM PULMONARY BILATERAL SELECTIVE  08/14/2021   IR ANGIOGRAM SELECTIVE EACH ADDITIONAL VESSEL  08/14/2021   IR ANGIOGRAM SELECTIVE EACH ADDITIONAL VESSEL  08/14/2021   IR INFUSION THROMBOL ARTERIAL INITIAL (MS)  08/14/2021   IR INFUSION THROMBOL ARTERIAL INITIAL (MS)  08/14/2021   IR THROMB F/U EVAL ART/VEN FINAL DAY (MS)  08/14/2021   IR THROMB F/U EVAL ART/VEN FINAL DAY (MS)  08/15/2021       No family history on file.  Social History   Tobacco Use   Smoking status: Every Day    Packs/day: 1.00    Types: Cigarettes   Smokeless tobacco: Never  Vaping Use    Vaping Use: Never used  Substance Use Topics   Alcohol use: Yes    Comment: ocaasional.   Drug use: No    Home Medications Prior to Admission medications   Medication Sig Start Date End Date Taking? Authorizing Provider  amoxicillin-clavulanate (AUGMENTIN) 875-125 MG tablet Take 1 tablet by mouth 2 (two) times daily for 7 days. 12/14/21 12/21/21 Yes Braeden Dolinski S, PA-C  apixaban (ELIQUIS) 5 MG TABS tablet Take 1 tablet (5 mg total) by mouth 2 (two) times daily. 12/14/21 01/13/22 Yes Angad Nabers S, PA-C  fluticasone (FLONASE) 50 MCG/ACT nasal spray Place 2 sprays into both nostrils daily. 12/14/21  Yes Annaya Bangert S, PA-C    Allergies    Patient has no known allergies.  Review of Systems   Review of Systems  Constitutional:  Positive for chills and diaphoresis.  HENT:  Positive for congestion and rhinorrhea. Negative for ear pain and sore throat.   Eyes:  Negative for visual disturbance.  Respiratory:  Negative for cough and shortness of breath.   Cardiovascular:  Negative for chest pain.  Gastrointestinal:  Negative for abdominal pain, constipation, diarrhea, nausea and vomiting.  Genitourinary:  Negative for dysuria and hematuria.  Musculoskeletal:  Negative for back pain.       Leg cramping  Skin:  Negative for rash.  Neurological:  Negative for headaches.  All other systems reviewed and  are negative.  Physical Exam Updated Vital Signs BP 139/81 (BP Location: Right Arm)    Pulse 60    Temp 98.9 F (37.2 C) (Oral)    Resp 17    Ht 5\' 9"  (1.753 m)    Wt 104.3 kg    SpO2 97%    BMI 33.97 kg/m   Physical Exam Vitals and nursing note reviewed.  Constitutional:      General: He is not in acute distress.    Appearance: He is well-developed.  HENT:     Head: Normocephalic and atraumatic.     Nose: No congestion or rhinorrhea.     Right Turbinates: Enlarged.     Left Turbinates: Enlarged.  Eyes:     Conjunctiva/sclera: Conjunctivae normal.  Cardiovascular:      Rate and Rhythm: Normal rate and regular rhythm.     Heart sounds: No murmur heard. Pulmonary:     Effort: Pulmonary effort is normal. No respiratory distress.     Breath sounds: Normal breath sounds.  Abdominal:     Palpations: Abdomen is soft.     Tenderness: There is no abdominal tenderness.  Musculoskeletal:        General: No swelling.     Cervical back: Neck supple.  Skin:    General: Skin is warm and dry.     Capillary Refill: Capillary refill takes less than 2 seconds.  Neurological:     Mental Status: He is alert.  Psychiatric:        Mood and Affect: Mood normal.    ED Results / Procedures / Treatments   Labs (all labs ordered are listed, but only abnormal results are displayed) Labs Reviewed  RESP PANEL BY RT-PCR (FLU A&B, COVID) [id number]    EKG None  Radiology VAS [place] LOWER EXTREMITY VENOUS (DVT) (7a-7p)  Result Date: 12/14/2021  Lower Venous DVT Study Patient Name:  Steve Branch  Date of Exam:   12/14/2021 Medical Rec #: [id number]         Accession #:    [id number] Date of Birth: 1991-04-16         Patient Gender: M Patient Age:   59 years Exam Location:  [facility] Procedure:      VAS [place] LOWER EXTREMITY VENOUS (DVT) Referring Phys: Viva Gallaher --------------------------------------------------------------------------------  Indications: LLE muscle spasms.  Risk Factors: PE/DVT in 07/2021. Anticoagulation: Prescribed Eliquis for prior DVT/PE but not taking as prescribed. Comparison Study: Previous exam on 08/13/2021 was positive for DVT in LLE (PopV,                   PTV, PeroV) Performing Technologist: Rogelia Rohrer RVT, RDMS  Examination Guidelines: A complete evaluation includes B-mode imaging, spectral Doppler, color Doppler, and power Doppler as needed of all accessible portions of each vessel. Bilateral testing is considered an integral part of a complete examination. Limited examinations for reoccurring indications may be performed as noted. The reflux  portion of the exam is performed with the patient in reverse Trendelenburg.  +-----+---------------+---------+-----------+----------+--------------+  RIGHT Compressibility Phasicity Spontaneity Properties Thrombus Aging  +-----+---------------+---------+-----------+----------+--------------+  CFV   Full            Yes       Yes                                    +-----+---------------+---------+-----------+----------+--------------+   +---------+---------------+---------+-----------+----------+--------------+  LEFT  Compressibility Phasicity Spontaneity Properties Thrombus Aging  +---------+---------------+---------+-----------+----------+--------------+  CFV       Full            Yes       Yes                                    +---------+---------------+---------+-----------+----------+--------------+  SFJ       Full                                                             +---------+---------------+---------+-----------+----------+--------------+  FV Prox   Full            Yes       Yes                                    +---------+---------------+---------+-----------+----------+--------------+  FV Mid    Full            Yes       Yes                                    +---------+---------------+---------+-----------+----------+--------------+  FV Distal Full            Yes       Yes                                    +---------+---------------+---------+-----------+----------+--------------+  PFV       Full                                                             +---------+---------------+---------+-----------+----------+--------------+  POP       Full            Yes       Yes                                    +---------+---------------+---------+-----------+----------+--------------+  PTV       Full                                                             +---------+---------------+---------+-----------+----------+--------------+  PERO      Full                                                              +---------+---------------+---------+-----------+----------+--------------+ Image 14  is PopV not FV Distal   Summary: RIGHT: - No evidence of common femoral vein obstruction.  LEFT: - Findings suggest resolution of previously noted thrombus. - There is no evidence of superficial venous thrombosis. - There is no evidence of deep vein thrombosis in the lower extremity.  - No cystic structure found in the popliteal fossa.  *See table(s) above for measurements and observations.    Preliminary     Procedures Procedures   Medications Ordered in ED Medications  apixaban (ELIQUIS) tablet 5 mg (has no administration in time range)  amoxicillin-clavulanate (AUGMENTIN) 875-125 MG per tablet 1 tablet (1 tablet Oral Given 12/14/21 2006)    ED Course  I have reviewed the triage vital signs and the nursing notes.  Pertinent labs & imaging results that were available during my care of the patient were reviewed by me and considered in my medical decision making (see chart for details).    MDM Rules/Calculators/A&P                          30 y/o male with sinus symptoms for the last 2 weeks. Has tried otc meds without relief. No systemic sxs however given duration of sxs will tx for bacterial sinusitis with augmentin. Also advised to use fluticasone and avoid afrin advised if sxs do not improve he may need to start on allergy medications.   Additionally pt c/o left leg cramping. LLE [place] neg for DVT. No cp, sob. States he needs a refill of his anticoagulation as he was not able to get this filled when discharged from the hospital earlier this year for saddle PE.  Rx given.  Transitions of care was consulted for PCP follow-up and medication assistance.     Final Clinical Impression(s) / ED Diagnoses Final diagnoses:  Nasal congestion  Leg cramps  Medication refill    Rx / DC Orders ED Discharge Orders          Ordered    apixaban (ELIQUIS) 5 MG TABS tablet  2 times daily        12/14/21  1924    amoxicillin-clavulanate (AUGMENTIN) 875-125 MG tablet  2 times daily        12/14/21 1924    fluticasone (FLONASE) 50 MCG/ACT nasal spray  Daily        12/14/21 1924             Bishop Dublin 12/14/21 2018    Carmin Muskrat, MD 12/14/21 2153

## 2021-12-14 NOTE — Discharge Instructions (Addendum)
You were given a prescription for antibiotics. Please take the antibiotic prescription fully.   Take eliquis as directed   I have placed a consultation with our transitions of care team who should help you get plugged in with a primary care provider and help with medication assistance for your blood thinning medicine.  They should be reaching out to you soon to give you more information about this going forward.  Please follow up with your primary care provider within 5-7 days for re-evaluation of your symptoms. If you do not have a primary care provider, information for a healthcare clinic has been provided for you to make arrangements for follow up care. Please return to the emergency department for any new or worsening symptoms.

## 2021-12-14 NOTE — ED Triage Notes (Signed)
Pt c/o HA x 3 months, left leg pain x 2 months.

## 2021-12-14 NOTE — ED Provider Notes (Signed)
Emergency Medicine Provider Triage Evaluation Note  Steve Branch , a 30 y.o. male  was evaluated in triage.  Pt complains of headache and nasal congestion x3 weeks. Denies fevers.  Is supposed to be on blood thinners but has not been taking them. Is c/o lle pain/muscle spasms  Review of Systems  Positive: Headache, nasal congestion, left leg pain Negative: fever  Physical Exam  BP 127/82 (BP Location: Left Arm)    Pulse 69    Temp 98.9 F (37.2 C) (Oral)    Resp 16    Ht 5\' 9"  (1.753 m)    Wt 104.3 kg    SpO2 96%    BMI 33.97 kg/m  Gen:   Awake, no distress   Resp:  Normal effort  MSK:   Moves extremities without difficulty    Medical Decision Making  Medically screening exam initiated at 4:10 PM.  Appropriate orders placed.  Benno Rounds was informed that the remainder of the evaluation will be completed by another provider, this initial triage assessment does not replace that evaluation, and the importance of remaining in the ED until their evaluation is complete.     Mena Goes 12/14/21 1610    12/16/21, MD 12/14/21 [phone]

## 2021-12-14 NOTE — Progress Notes (Signed)
LLE venous duplex has been completed.  Preliminary results given to Arthor Captain, PA.   Results can be found under chart review under CV PROC. 12/14/2021 4:51 PM Javion Holmer RVT, RDMS

## 2022-02-02 ENCOUNTER — Inpatient Hospital Stay: Payer: Self-pay | Admitting: Internal Medicine

## 2022-10-07 ENCOUNTER — Encounter (HOSPITAL_COMMUNITY): Payer: Self-pay | Admitting: Emergency Medicine

## 2022-10-07 ENCOUNTER — Other Ambulatory Visit: Payer: Self-pay

## 2022-10-07 ENCOUNTER — Emergency Department (HOSPITAL_COMMUNITY): Payer: Self-pay

## 2022-10-07 ENCOUNTER — Emergency Department (HOSPITAL_COMMUNITY)
Admission: EM | Admit: 2022-10-07 | Discharge: 2022-10-07 | Disposition: A | Discharge status: Home / Self Care | Payer: Self-pay | Attending: Emergency Medicine | Admitting: Emergency Medicine

## 2022-10-07 DIAGNOSIS — M25561 Pain in right knee: Secondary | ICD-10-CM | POA: Insufficient documentation

## 2022-10-07 HISTORY — DX: Other pulmonary embolism without acute cor pulmonale: I26.99

## 2022-10-07 NOTE — ED Triage Notes (Signed)
Pt c/o right knee pain after falling x 2 days ago.

## 2022-10-07 NOTE — ED Provider Notes (Signed)
[facility] DEPT Provider Note   CSN: [id number] Arrival date & time: 10/07/22  2116     History  Chief Complaint  Patient presents with   Knee Pain    Steve Branch is a 31 y.o. male with noncontributory past medical history presents with concern for right knee pain after falling on right knee yesterday.  Patient denies any numbness, tingling, reports that some pain radiates to his right hip.  He reports that he has been able to ambulate on the knee without significant difficulty.  He has not taken anything for pain prior to arrival.  Denies any other injuries, he does take a blood thinner secondary to history of PE, and A-fib.  He has not noticed significant joint swelling or effusion.  Denies fever, chills, difficulty with coordination.   Knee Pain      Home Medications Prior to Admission medications   Medication Sig Start Date End Date Taking? Authorizing Provider  apixaban (ELIQUIS) 5 MG TABS tablet Take 1 tablet (5 mg total) by mouth 2 (two) times daily. 12/14/21 01/13/22  Couture, Cortni S, PA-C  fluticasone (FLONASE) 50 MCG/ACT nasal spray Place 2 sprays into both nostrils daily. 12/14/21   Couture, Cortni S, PA-C      Allergies    Patient has no known allergies.    Review of Systems   Review of Systems  All other systems reviewed and are negative.   Physical Exam Updated Vital Signs BP (!) 146/72 (BP Location: Right Arm)   Pulse 86   Temp 98.4 F (36.9 C) (Oral)   Resp 18   Ht 5\' 10"  (1.778 m)   Wt 108.9 kg   SpO2 98%   BMI 34.44 kg/m  Physical Exam Vitals and nursing note reviewed.  Constitutional:      General: He is not in acute distress.    Appearance: Normal appearance.  HENT:     Head: Normocephalic and atraumatic.  Eyes:     General:        Right eye: No discharge.        Left eye: No discharge.  Cardiovascular:     Rate and Rhythm: Normal rate and regular rhythm.     Pulses: Normal pulses.  Pulmonary:      Effort: Pulmonary effort is normal. No respiratory distress.  Musculoskeletal:        General: No deformity.     Comments: Patient with overall benign appearance of right knee, he has some minimal tenderness to palpation at the medial aspect of the patella.  He has no laxity to varus, valgus stress, anterior posterior drawer test.  He has no joint effusion, negative Cullen, ballottement.  Patient without significant hip tenderness, no deformity.  He is intact strength 5/5 bilateral lower extremities, at the hip, knee, and ankle.  He is able to ambulate without difficulty, gait appears normal.  Skin:    General: Skin is warm and dry.     Capillary Refill: Capillary refill takes less than 2 seconds.  Neurological:     Mental Status: He is alert and oriented to person, place, and time.  Psychiatric:        Mood and Affect: Mood normal.        Behavior: Behavior normal.     ED Results / Procedures / Treatments   Labs (all labs ordered are listed, but only abnormal results are displayed) Labs Reviewed - No data to display  EKG None  Radiology DG Knee Complete 4  Views Right  Result Date: 10/07/2022 CLINICAL DATA:  Right-sided knee pain EXAM: RIGHT KNEE - COMPLETE 4+ VIEW COMPARISON:  None Available. FINDINGS: No evidence of fracture, dislocation, or joint effusion. No evidence of arthropathy or other focal bone abnormality. Soft tissues are unremarkable. IMPRESSION: Negative. Electronically Signed   By: Jasmine Pang M.D.   On: 10/07/2022 22:16    Procedures Procedures    Medications Ordered in ED Medications - No data to display  ED Course/ Medical Decision Making/ A&P                           Medical Decision Making Amount and/or Complexity of Data Reviewed Radiology: ordered.   This is a well-appearing 31 year old male who presents with concern for right knee pain after falling on the ground mechanically yesterday.  He does take a blood thinner secondary to pulmonary  embolism, history of A-fib, but did not hit his head, did not lose consciousness.  He reports some pain in the right hip as well.  On my exam he is neurovascularly intact with a fairly benign appearance of the right knee other than some minor medial patella tenderness.  Normal range of motion, normal coordination  I independently interpreted imaging including plain film radiographs of the right knee which shows no acute fracture, dislocation, joint effusion. I agree with the radiologist interpretation.  Encouraged ibuprofen, Tylenol, rest, ice, compression, elevation, orthopedic follow-up as needed.  Patient discharged in stable condition at this time, return precautions given.  Final Clinical Impression(s) / ED Diagnoses Final diagnoses:  Acute pain of right knee    Rx / DC Orders ED Discharge Orders     None         [place] 10/07/22 2259    Arby Barrette, MD 10/08/22 1458

## 2022-10-07 NOTE — Discharge Instructions (Signed)
Please use Tylenol or ibuprofen for pain.  You may use 600 mg ibuprofen every 6 hours or 1000 mg of Tylenol every 6 hours.  You may choose to alternate between the 2.  This would be most effective.  Not to exceed 4 g of Tylenol within 24 hours.  Not to exceed 3200 mg ibuprofen 24 hours.  

## 2023-03-31 ENCOUNTER — Emergency Department (HOSPITAL_COMMUNITY)
Admission: EM | Admit: 2023-03-31 | Discharge: 2023-04-01 | Disposition: A | Discharge status: Home / Self Care | Payer: Self-pay | Attending: Emergency Medicine | Admitting: Emergency Medicine

## 2023-03-31 ENCOUNTER — Encounter (HOSPITAL_COMMUNITY): Payer: Self-pay | Admitting: Emergency Medicine

## 2023-03-31 DIAGNOSIS — Z859 Personal history of malignant neoplasm, unspecified: Secondary | ICD-10-CM | POA: Insufficient documentation

## 2023-03-31 DIAGNOSIS — Z7901 Long term (current) use of anticoagulants: Secondary | ICD-10-CM | POA: Insufficient documentation

## 2023-03-31 DIAGNOSIS — M546 Pain in thoracic spine: Secondary | ICD-10-CM | POA: Insufficient documentation

## 2023-03-31 DIAGNOSIS — M549 Dorsalgia, unspecified: Secondary | ICD-10-CM

## 2023-03-31 DIAGNOSIS — W19XXXA Unspecified fall, initial encounter: Secondary | ICD-10-CM | POA: Insufficient documentation

## 2023-03-31 NOTE — ED Triage Notes (Addendum)
PT states he fell yesterday. Did not lose consciousness or hit head. Back has been feeling tight since. States he can feel it pop. No signs of distress. Pt has hx of PEs- is supposed to be on thinners but is not taking them. States this pain feels different than when he had PEs. Denies SOB or chest pain.

## 2023-04-01 ENCOUNTER — Emergency Department (HOSPITAL_COMMUNITY): Payer: Self-pay

## 2023-04-01 NOTE — ED Provider Notes (Signed)
[facility] Provider Note   CSN: [id number] Arrival date & time: 03/31/23  2055     History  Chief Complaint  Patient presents with   Back Pain    Steve Branch is a 32 y.o. male.  Patient presents to the emergency department for evaluation of right-sided mid back pain.  Patient states that he was trying to step over a post in the ground yesterday when he fell onto his right side.  This was a fall from standing.  No head injury.  He states that he "felt a pop" in his mid back.  Since that time he has had "tightness".  He has been doing some stretches at home to help.  No oral medications.  Patient denies warning symptoms of back pain including: fecal incontinence, urinary retention or overflow incontinence, night sweats, waking from sleep with back pain, unexplained fevers or weight loss, h/o cancer, IVDU, recent trauma.     Patient has a history of massive PE.  He was noncompliant with his medications.  He does not know if he is still supposed to be on them or not.  He does not have a primary care doctor.       Home Medications Prior to Admission medications   Medication Sig Start Date End Date Taking? Authorizing Provider  apixaban (ELIQUIS) 5 MG TABS tablet Take 1 tablet (5 mg total) by mouth 2 (two) times daily. 12/14/21 01/13/22  Couture, Cortni S, PA-C  fluticasone (FLONASE) 50 MCG/ACT nasal spray Place 2 sprays into both nostrils daily. 12/14/21   Couture, Cortni S, PA-C      Allergies    Patient has no known allergies.    Review of Systems   Review of Systems  Physical Exam Updated Vital Signs BP 124/79 (BP Location: Right Arm)   Pulse 85   Temp 97.8 F (36.6 C) (Oral)   Resp 19   SpO2 99%  Physical Exam Vitals and nursing note reviewed.  Constitutional:      Appearance: He is well-developed.  HENT:     Head: Normocephalic and atraumatic.  Eyes:     Conjunctiva/sclera: Conjunctivae normal.  Abdominal:      Palpations: Abdomen is soft.     Tenderness: There is no abdominal tenderness. There is no right CVA tenderness or left CVA tenderness.  Musculoskeletal:        General: Normal range of motion.     Cervical back: Normal range of motion. No tenderness. Normal range of motion.     Thoracic back: Spasms and tenderness present. Normal range of motion.     Lumbar back: No tenderness. Normal range of motion.       Back:     Comments: No step-off noted with palpation of spine.   Skin:    General: Skin is warm and dry.  Neurological:     Mental Status: He is alert.     Sensory: No sensory deficit.     Motor: No abnormal muscle tone.     Comments: 5/5 strength in entire lower extremities bilaterally. No sensation deficit.  Patient is able to stand from sitting without difficulty.  He also sits from a lying position without any difficulty or distress.  Psychiatric:        Mood and Affect: Mood normal.     ED Results / Procedures / Treatments   Labs (all labs ordered are listed, but only abnormal results are displayed) Labs Reviewed - No data to  display  EKG None  Radiology No results found.  Procedures Procedures    Medications Ordered in ED Medications - No data to display  ED Course/ Medical Decision Making/ A&P    Patient seen and examined. History obtained directly from patient.   Labs/EKG: None ordered  Imaging: Given fall and trauma, will check thoracic spine films.  Medications/Fluids: None ordered  Most recent vital signs reviewed and are as follows: BP 124/79 (BP Location: Right Arm)   Pulse 85   Temp 97.8 F (36.6 C) (Oral)   Resp 19   SpO2 99%   Initial impression: Mid back pain after a fall.  Patient without DVT or PE symptoms tonight.  Symptoms, per history, obviously traumatic in nature.  Do not suspect PE at this time.  Anticipate discharged home with a muscle relaxer, continued OTC meds, heat.  Will give PCP referral.  2:33 AM patient reexamined,  stable.  Appears comfortable.  X-rays personally reviewed and interpreted including thoracic spine x-ray, agree negative.  Home treatment plan: Patient was counseled on back pain precautions and advised to do activity as tolerated but avoid strenuous activity and do not lift, push, or pull heavy objects more than 10 pounds for the next week.  Patient counseled to use ice or heat on back as needed for pain and spasm.    Medications prescribed:  Robaxin for muscle pain and spasm. Patient counseled on proper use of muscle relaxant medication.  They were requested not to drink alcohol, drive any vehicle, or do any dangerous activities while taking this medication due to potential drowsiness and unintended.  Patient verbalized understanding.  Also prescribed course of naproxen.  Return instructions discussed with patient: Urged to return with worsening severe pain, loss of bowel or bladder control, trouble walking, development of weakness in the legs, or with any other concerns.  Follow-up instructions discussed with patient: Patient urged to follow-up with PCP if pain does not improve with treatment and rest or if pain becomes recurrent.                               Medical Decision Making Amount and/or Complexity of Data Reviewed Radiology: ordered.  Patient with back pain after fall.  No radiculopathy type symptoms.  No neurological deficits. Patient is ambulatory. No warning symptoms of back pain including: fecal incontinence, urinary retention or overflow incontinence, night sweats, waking from sleep with back pain, unexplained fevers or weight loss, h/o cancer, IVDU, recent trauma. No concern for cauda equina, epidural abscess, or other serious cause of back pain.  Plain films are negative.  Conservative measures such as rest, ice/heat and pain medicine indicated with PCP follow-up if no improvement with conservative management.   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  Symptoms started  after a fall and I do not suspect that his current clinical scenario is consistent with recurrent PE.  Vital signs are normal without tachycardia or hypoxia.  Patient should try to establish primary care follow-up to determine if he needs lifelong anticoagulation or not.  Feel that this can be done as an outpatient.         Final Clinical Impression(s) / ED Diagnoses Final diagnoses:  Mid back pain  Fall, initial encounter    Rx / DC Orders ED Discharge Orders     None         Carlisle Cater, Hershal Coria 04/01/23 0235    Quintella Reichert, MD  04/01/23 0305  

## 2023-04-01 NOTE — Discharge Instructions (Addendum)
Please read and follow all provided instructions.  Your diagnoses today include:  1. Mid back pain   2. Fall, initial encounter     Tests performed today include: Vital signs - see below for your results today  Medications prescribed:  Robaxin (methocarbamol) - muscle relaxer medication  DO NOT drive or perform any activities that require you to be awake and alert because this medicine can make you drowsy.   Naproxen - anti-inflammatory pain medication Do not exceed 500mg  naproxen every 12 hours, take with food  You have been prescribed an anti-inflammatory medication or NSAID. Take with food. Take smallest effective dose for the shortest duration needed for your pain. Stop taking if you experience stomach pain or vomiting.   Take any prescribed medications only as directed.  Home care instructions:  Follow any educational materials contained in this packet Please rest, use ice or heat on your back for the next several days Do not lift, push, pull anything more than 10 pounds for the next week  Follow-up instructions: Please follow-up with your primary care provider in the next 1 week for further evaluation of your symptoms and to discuss need for blood thinner.   Return instructions:  SEEK IMMEDIATE MEDICAL ATTENTION IF YOU HAVE: New numbness, tingling, weakness, or problem with the use of your arms or legs Severe back pain not relieved with medications Loss control of your bowels or bladder Increasing pain in any areas of the body (such as chest or abdominal pain) Shortness of breath, dizziness, or fainting.  Worsening nausea (feeling sick to your stomach), vomiting, fever, or sweats Any other emergent concerns regarding your health   Additional Information:  Your vital signs today were: BP 128/72 (BP Location: Right Arm)   Pulse 88   Temp 97.9 F (36.6 C) (Oral)   Resp 10   SpO2 100%  If your blood pressure (BP) was elevated above 135/85 this visit, please have  this repeated by your doctor within one month. --------------

## 2023-07-22 ENCOUNTER — Encounter (HOSPITAL_COMMUNITY): Payer: Self-pay

## 2023-07-22 ENCOUNTER — Emergency Department (HOSPITAL_COMMUNITY)
Admission: EM | Admit: 2023-07-22 | Discharge: 2023-07-22 | Disposition: A | Discharge status: Home / Self Care | Payer: Self-pay | Attending: Emergency Medicine | Admitting: Emergency Medicine

## 2023-07-22 ENCOUNTER — Emergency Department (HOSPITAL_BASED_OUTPATIENT_CLINIC_OR_DEPARTMENT_OTHER): Payer: Self-pay

## 2023-07-22 ENCOUNTER — Other Ambulatory Visit: Payer: Self-pay

## 2023-07-22 DIAGNOSIS — M79661 Pain in right lower leg: Secondary | ICD-10-CM | POA: Insufficient documentation

## 2023-07-22 NOTE — ED Triage Notes (Signed)
Patient presents to ER for pain in right calf. Patient has history of blood clots, does not take eliquis.

## 2023-07-22 NOTE — Care Management (Addendum)
Transition of Care [facility]) - Emergency Department Mini Assessment   Patient Details  Name: Steve Branch MRN: [id number] Date of Birth: 01/17/1991  [facility]) CM/SW Contact:    Lavenia Atlas, RN Phone Number: 07/22/2023, 1:01 PM   Clinical Narrative: Received TOC consult for medication assistance. Per chart review patient has been on Eliquis prior to admission. Patient does not have a PCP on file.This RNCM spoke with patient at bedside who reports he received a starter pack of Eliquis and took for 30 days. Patient reports he has not taken for 6-7 months. Patient is eligible for [facility] program however will not cover Eliquis as he has received his lifetime 30 day supply.  This RNCM notified EDP to possibility of changing medications to Xarelto to receive a 30 day starter and get set up with PCP to follow up with manufacturer's medication assistance program.  Awaiting response   - 1:38 pm PCP appointment for follow-up and establishment has been scheduled for 08/06/2023 at 10:45 am. PCP info added to AVS  Transportation at discharge: POV  TOC will continue to follow for needs.  ED Mini Assessment: What brought you to the Emergency Department? : patient complains of pain in right calf  Barriers to Discharge: Continued Medical Work up  [employer] interventions: medication assistance  Means of departure: Car  Interventions which prevented an admission or readmission: Medication Review    Patient Contact and Communications       Contact Date: 07/22/23,          Patient states their goals for this hospitalization and ongoing recovery are:: to feel better CMS Medicare.gov Compare Post Acute Care list provided to:: Patient Choice offered to / list presented to : Patient  Admission diagnosis:  right leg pain Patient Active Problem List   Diagnosis Date Noted   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 [place] [address] Phone: [phone] Fax: [phone]  [id number] [place], [place] - [address] AT [facility] [address] [place] [address] Phone: [phone] Fax: [phone]

## 2023-07-22 NOTE — Progress Notes (Signed)
Lower extremity venous right study completed.   Please see CV Proc for preliminary results.   Rachel Hodge, RDMS, RVT  

## 2023-07-22 NOTE — Discharge Instructions (Signed)
No clot on ultrasound. PCP appointment established for follow up.

## 2023-07-28 ENCOUNTER — Encounter (HOSPITAL_COMMUNITY): Payer: Self-pay

## 2023-07-28 ENCOUNTER — Emergency Department (HOSPITAL_COMMUNITY)
Admission: EM | Admit: 2023-07-28 | Discharge: 2023-07-28 | Disposition: A | Discharge status: Home / Self Care | Payer: Self-pay | Attending: Emergency Medicine | Admitting: Emergency Medicine

## 2023-07-28 ENCOUNTER — Other Ambulatory Visit: Payer: Self-pay

## 2023-07-28 DIAGNOSIS — M79604 Pain in right leg: Secondary | ICD-10-CM | POA: Insufficient documentation

## 2023-07-28 DIAGNOSIS — R600 Localized edema: Secondary | ICD-10-CM | POA: Insufficient documentation

## 2023-07-28 DIAGNOSIS — Z7901 Long term (current) use of anticoagulants: Secondary | ICD-10-CM | POA: Insufficient documentation

## 2023-07-28 HISTORY — DX: Acute embolism and thrombosis of unspecified deep veins of unspecified lower extremity: I82.409

## 2023-07-28 MED ORDER — APIXABAN 5 MG PO TABS
10.0000 mg | ORAL_TABLET | Freq: Once | ORAL | Status: AC
  Administered 2023-07-28: 10 mg via ORAL
  Filled 2023-07-28: qty 2

## 2023-07-28 NOTE — ED Provider Notes (Signed)
[facility] EMERGENCY DEPARTMENT AT [facility] Provider Note   CSN: [id number] Arrival date & time: 07/28/23  1640     History  Chief Complaint  Patient presents with   Leg Pain    Steve Branch is a 32 y.o. male.  Pt is a 32 yo male with pmhx significant for saddle PE, DVT, and migraines.  Pt is supposed to be on Eliquis, but stopped it due to no insurance.  He has been having right sided calf pain for several days.  He went to [facility] on 7/25 for the same pain.  He had a [place] which did show no DVT.  He is back today because his leg is more swollen and he's concerned the initial [place] missed the clot.  He denies any sob or cp.          Home Medications Prior to Admission medications   Medication Sig Start Date End Date Taking? Authorizing Provider  apixaban (ELIQUIS) 5 MG TABS tablet Take 1 tablet (5 mg total) by mouth 2 (two) times daily. 12/14/21 01/13/22  Couture, Cortni S, PA-C  fluticasone (FLONASE) 50 MCG/ACT nasal spray Place 2 sprays into both nostrils daily. 12/14/21   Couture, Cortni S, PA-C      Allergies    Patient has no known allergies.    Review of Systems   Review of Systems  Cardiovascular:  Positive for leg swelling.  Musculoskeletal:        Right calf pain  All other systems reviewed and are negative.   Physical Exam Updated Vital Signs BP (!) 150/90   Pulse 95   Temp 99.4 F (37.4 C) (Oral)   Resp 20   Ht 5\' 9"  (1.753 m)   Wt 109 kg   SpO2 98%   BMI 35.49 kg/m  Physical Exam Vitals and nursing note reviewed.  Constitutional:      Appearance: Normal appearance.  HENT:     Head: Normocephalic and atraumatic.     Right Ear: External ear normal.     Left Ear: External ear normal.     Nose: Nose normal.     Mouth/Throat:     Mouth: Mucous membranes are moist.     Pharynx: Oropharynx is clear.  Eyes:     Extraocular Movements: Extraocular movements intact.     Conjunctiva/sclera: Conjunctivae normal.     Pupils: Pupils are equal,  round, and reactive to light.  Cardiovascular:     Rate and Rhythm: Normal rate and regular rhythm.     Pulses: Normal pulses.     Heart sounds: Normal heart sounds.  Pulmonary:     Effort: Pulmonary effort is normal.     Breath sounds: Normal breath sounds.  Abdominal:     General: Abdomen is flat. Bowel sounds are normal.     Palpations: Abdomen is soft.  Musculoskeletal:     Cervical back: Normal range of motion and neck supple.     Right lower leg: Edema present.  Skin:    General: Skin is warm.     Capillary Refill: Capillary refill takes less than 2 seconds.  Neurological:     General: No focal deficit present.     Mental Status: He is alert and oriented to person, place, and time.  Psychiatric:        Mood and Affect: Mood normal.        Behavior: Behavior normal.     ED Results / Procedures / Treatments   Labs (all  labs ordered are listed, but only abnormal results are displayed) Labs Reviewed - No data to display  EKG None  Radiology No results found.  Procedures Procedures    Medications Ordered in ED Medications  apixaban (ELIQUIS) tablet 10 mg (has no administration in time range)    ED Course/ Medical Decision Making/ A&P                                 Medical Decision Making Risk Prescription drug management.   This patient presents to the ED for concern of leg pain, this involves an extensive number of treatment options, and is a complaint that carries with it a high risk of complications and morbidity.  The differential diagnosis includes dvt, msk   Co morbidities that complicate the patient evaluation  saddle PE, DVT, and migraines   Additional history obtained:  Additional history obtained from epic chart review  Test Considered:  [place]   Problem List / ED Course:  RLE pain/swelling:  recent US neg for DVT, but pt reports sx worse. Repeat US ordered for tomorrow.  He was given 1 dose of Eliquis in ED pending [place] tomorrow  morning.     Reevaluation:  After the interventions noted above, I reevaluated the patient and found that they have :improved   Social Determinants of Health:  Lives at home; no insurance; no pcp   Dispostion:  After consideration of the diagnostic results and the patients response to treatment, I feel that the patent would benefit from discharge with outpatient f/u.          Final Clinical Impression(s) / ED Diagnoses Final diagnoses:  Right leg pain    Rx / DC Orders ED Discharge Orders          Ordered    [place]        07/28/23 1924              Jacalyn Lefevre, MD 07/31/23 1500

## 2023-07-28 NOTE — ED Triage Notes (Signed)
Pt c/o right calf painx3d. Pt denies injury. No swelling noted.

## 2023-07-29 ENCOUNTER — Telehealth: Payer: Self-pay

## 2023-07-29 ENCOUNTER — Ambulatory Visit (HOSPITAL_COMMUNITY)
Admission: RE | Admit: 2023-07-29 | Discharge: 2023-07-29 | Disposition: A | Discharge status: Home / Self Care | Payer: Self-pay | Source: Ambulatory Visit | Attending: Emergency Medicine | Admitting: Emergency Medicine

## 2023-07-29 DIAGNOSIS — Z86711 Personal history of pulmonary embolism: Secondary | ICD-10-CM | POA: Insufficient documentation

## 2023-07-29 DIAGNOSIS — I82409 Acute embolism and thrombosis of unspecified deep veins of unspecified lower extremity: Secondary | ICD-10-CM | POA: Insufficient documentation

## 2023-07-29 DIAGNOSIS — Z86718 Personal history of other venous thrombosis and embolism: Secondary | ICD-10-CM

## 2023-07-29 NOTE — Telephone Encounter (Signed)
This RNCM spoke with patient re: patient not receiving Eliquis prescription on yesterday. Patient reports he came back to the [facility] ED on yesterday 07/28/23 and was given a coupon for Eliquis however he did not receive a prescription. Per chart review patient has already received the lifetime Eliquis coupon however received another on yesterday. Patient reports on 07/22/23 he was told "no s/s of clots".  No note from EDP on 07/22/23. This RNCM notified EDP [place] (who saw patient on 07/28/23) who may not be in office today. This RNCM advised patient he can return to ED or go to his PCP appointment.

## 2023-07-29 NOTE — Progress Notes (Signed)
VASCULAR LAB    Right lower extremity venous duplex has been performed.  See CV proc for preliminary results.   Cassidee Deats, RVT 07/29/2023, 11:18 AM

## 2023-08-06 ENCOUNTER — Ambulatory Visit: Payer: Self-pay | Admitting: Student

## 2023-08-11 ENCOUNTER — Ambulatory Visit (INDEPENDENT_AMBULATORY_CARE_PROVIDER_SITE_OTHER): Payer: Self-pay | Admitting: Student

## 2023-08-11 ENCOUNTER — Other Ambulatory Visit (HOSPITAL_COMMUNITY): Payer: Self-pay

## 2023-08-11 ENCOUNTER — Encounter: Payer: Self-pay | Admitting: Student

## 2023-08-11 VITALS — BP 132/74 | HR 63 | Temp 98.2°F | Ht 69.0 in | Wt 246.2 lb

## 2023-08-11 DIAGNOSIS — Z7901 Long term (current) use of anticoagulants: Secondary | ICD-10-CM

## 2023-08-11 DIAGNOSIS — Z789 Other specified health status: Secondary | ICD-10-CM

## 2023-08-11 DIAGNOSIS — F17210 Nicotine dependence, cigarettes, uncomplicated: Secondary | ICD-10-CM

## 2023-08-11 DIAGNOSIS — Z720 Tobacco use: Secondary | ICD-10-CM

## 2023-08-11 DIAGNOSIS — F1090 Alcohol use, unspecified, uncomplicated: Secondary | ICD-10-CM

## 2023-08-11 MED ORDER — APIXABAN 5 MG PO TABS
5.0000 mg | ORAL_TABLET | Freq: Two times a day (BID) | ORAL | 0 refills | Status: AC
  Filled 2023-08-11 – 2023-08-13 (×2): qty 60, 30d supply, fill #0

## 2023-08-11 MED ORDER — VARENICLINE TARTRATE (STARTER) 0.5 MG X 11 & 1 MG X 42 PO TBPK
ORAL_TABLET | ORAL | 0 refills | Status: AC
  Filled 2023-08-11: qty 53, 28d supply, fill #0

## 2023-08-11 NOTE — Progress Notes (Incomplete)
Established Patient Office Visit  Subjective   [name], male    DOB: [birth year]  Age: 32 y.o. MRN: [id number]  Chief Complaint  Patient presents with   Establish Care    New ED follow up Was unable to get eliquis refilled-last dose taken 2wks ago    HPI  Past Medical History:  Diagnosis Date   DVT (deep venous thrombosis) (HCC)    Migraine    Pulmonary embolism (HCC)     Review of Systems  Constitutional:  Negative for chills and fever.  Eyes:  Negative for blurred vision and double vision.  Respiratory:  Positive for cough.        Feels winded at times, especially with exertion.   Cardiovascular:  Negative for chest pain, palpitations and leg swelling.  Gastrointestinal:  Negative for nausea and vomiting.  Genitourinary:  Negative for dysuria.  Skin:  Negative for rash.  Neurological:  Negative for headaches.  Psychiatric/Behavioral:         Stressed      Objective:     BP 132/74 (BP Location: Right Arm, Patient Position: Sitting, Cuff Size: Normal)   Pulse 63   Temp 98.2 F (36.8 C) (Oral)   Ht 5\' 9"  (1.753 m)   Wt 246 lb 3.2 oz (111.7 kg)   SpO2 98%   BMI 36.36 kg/m  BP Readings from Last 3 Encounters:  08/11/23 132/74  07/28/23 (!) 150/90  07/22/23 127/86   Wt Readings from Last 3 Encounters:  08/11/23 246 lb 3.2 oz (111.7 kg)  07/28/23 240 lb 4.8 oz (109 kg)  07/22/23 240 lb 4.8 oz (109 kg)   Physical Exam HENT:     Head: Normocephalic and atraumatic.     Mouth/Throat:     Mouth: Mucous membranes are moist.  Eyes:     Extraocular Movements: Extraocular movements intact.     Pupils: Pupils are equal, round, and reactive to light.  Cardiovascular:     Rate and Rhythm: Normal rate and regular rhythm.     Pulses: Normal pulses.     Heart sounds: No murmur heard. Pulmonary:     Effort: Pulmonary effort is normal.     Breath sounds: Wheezing present.  Abdominal:     General: Bowel sounds are normal. There is no  distension.     Palpations: Abdomen is soft.     Tenderness: There is no abdominal tenderness.  Musculoskeletal:        General: No swelling.  Skin:    General: Skin is warm and dry.  Neurological:     General: No focal deficit present.     Mental Status: He is alert and oriented to person, place, and time.     Cranial Nerves: No cranial nerve deficit.     Motor: No weakness.     Gait: Gait normal.  Psychiatric:        Mood and Affect: Mood normal.        Behavior: Behavior normal.     No results found for any visits on 08/11/23.  Last metabolic panel Lab Results  Component Value Date   GLUCOSE 80 08/15/2021   NA 134 (L) 08/15/2021   K 4.4 08/15/2021   CL 103 08/15/2021   CO2 20 (L) 08/15/2021   BUN 6 08/15/2021   CREATININE 0.89 08/15/2021   GFRNONAA >60 08/15/2021   CALCIUM 8.8 (L) 08/15/2021   PHOS 4.0 08/15/2021   PROT 7.6 07/29/2018   ALBUMIN 4.0 07/29/2018  BILITOT 0.9 07/29/2018   ALKPHOS 69 07/29/2018   AST 59 (H) 07/29/2018   ALT 56 (H) 07/29/2018   ANIONGAP 11 08/15/2021     The ASCVD Risk score (Arnett DK, et al., 2019) failed to calculate for the following reasons:   The 2019 ASCVD risk score is only valid for ages 77 to 66    Assessment & Plan:   Problem List Items Addressed This Visit       Other   Anticoagulant long-term use - Primary    Return in about 4 weeks (around 09/08/2023) for Eliquis and Chantix follow up .   Patient seen with Dr. Mikey Bussing.  Briseis Aguilera Colbert Coyer, MD

## 2023-08-11 NOTE — Patient Instructions (Addendum)
Thank you, Mr.Jovane Reamer for allowing [place] to provide your care today. Today we discussed your anticoagulation needs for your clotting as well as smoking cessation and alcohol use.   I have ordered the following labs for you:  Lab Orders  No laboratory test(s) ordered today     Tests ordered today:  - None  Referrals ordered today:   Referral Orders  No referral(s) requested today     I have ordered the following medication/changed the following medications:   Stop the following medications: There are no discontinued medications.   Start the following medications: No orders of the defined types were placed in this encounter.    Follow up:  4 weeks     Remember:   - Please be sure to pick up your prescription for Eliquis (5mg  twice a day) and Chantix starter pack (for smoking cessation).  - Chantix instructions: 0.5mg  once daily for 3 days, then 0.5mg  twice a day for 4 days, then 1mg  twice a day.   - Finish your [employer]. Once you have that, we can order your baseline and maintenance labs at your next. visit.   - We can discuss your alcohol use at the next visit.   Should you have any questions or concerns please call the internal medicine clinic at [phone].     Ej Pinson Colbert Coyer, MD PGY-1 Internal Medicine Teaching Progam [facility]

## 2023-08-12 ENCOUNTER — Encounter: Payer: Self-pay | Admitting: Student

## 2023-08-12 ENCOUNTER — Other Ambulatory Visit (HOSPITAL_COMMUNITY): Payer: Self-pay

## 2023-08-12 DIAGNOSIS — Z720 Tobacco use: Secondary | ICD-10-CM | POA: Insufficient documentation

## 2023-08-12 DIAGNOSIS — Z789 Other specified health status: Secondary | ICD-10-CM | POA: Insufficient documentation

## 2023-08-12 NOTE — Assessment & Plan Note (Addendum)
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 Per chart review, saddle PE was considered unprovoked. Family history includes father with history of PE. Patient has never been diagnosed with a blood/blotting disorder. 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 Per patient, he has been prescribed eliquis 5 mg BID in the past but it was too expensive to take consistently. He was given one dose 5 mg dose of eliquis before ED discharge on 7/31 and coupons for eliquis but he states prescription was never sent to the pharmacy and previous prescription was expired. Patient presents today without signs or symptoms concerning for DVT. Patient currently unemployed and uninsured; received application for [employer] today. Will need to monitor DVT/clotting recurrence to help determine if anticoagulation will be for 3-6 months or to be taken indefinitely.  Plan:  - Sent eliquis 5 mg BID prescription to [facility] community pharmacy under IM program  - Follow up at appointment in 4 weeks

## 2023-08-12 NOTE — Assessment & Plan Note (Signed)
Patient smokes about a pack of cigarettes a day. Reports many years of this. Has not tried to quit in the past but reports he had no problems going without a cigarette while he was in the hospital. Previously participated in smoking cessation study where he quit "cold [place]", but started again shortly after study ended. Today he expressed interest in trying to quit, given studies that show cigarette smoking is associated with increased risk of blood clots.  Plan:  - Patient to decide on quit date, prescribed Chantix starter pack; follow up at next visit in 4 weeks

## 2023-08-12 NOTE — ED Provider Notes (Signed)
[place] EMERGENCY DEPARTMENT AT [facility] Provider Note   CSN: [id number] Arrival date & time: 07/22/23  1130     History  Chief Complaint  Patient presents with   Leg Pain    Steve Branch is a 32 y.o. male.  Steve Branch is a 32 y.o. male with history of previous DVT and PE, who presents to the ED for evaluation of right calf pain that has been present for a few days.  Patient has not noticed any swelling.  He reports that he could have potentially strained it but given his history of blood clots was concerned.  He was previously on Eliquis after blood clots were diagnosed in August 2022, but reports that he did not have insurance so did not continue this medication after the starter pack and has not followed up, has not had any issues or symptoms since stopping the medication until now.  No chest pain or shortness of breath.  He has not noted any discoloration in the right leg, no numbness tingling or weakness.  No other symptoms reported  The history is provided by the patient and medical records.  Leg Pain      Home Medications Prior to Admission medications   Medication Sig Start Date End Date Taking? Authorizing Provider  apixaban (ELIQUIS) 5 MG TABS tablet Take 1 tablet (5 mg total) by mouth 2 (two) times daily. 08/11/23   Philomena Doheny, MD  fluticasone (FLONASE) 50 MCG/ACT nasal spray Place 2 sprays into both nostrils daily. 12/14/21   Couture, Cortni S, PA-C  Varenicline Tartrate, Starter, (CHANTIX STARTING MONTH PAK) 0.5 MG X 11 & 1 MG X 42 TBPK Take one 0.5mg  tablet once daily for 3 days, then take one 0.5mg  tablet twice daily for 4 days, then take one 1mg  tablet twice daily thereafter as directed on package 08/11/23   Philomena Doheny, MD      Allergies    Patient has no known allergies.    Review of Systems   Review of Systems  Constitutional:  Negative for chills.  Respiratory:  Negative for shortness of breath.    Cardiovascular:  Negative for chest pain and leg swelling.  Musculoskeletal:  Positive for myalgias.    Physical Exam Updated Vital Signs BP 127/86 (BP Location: Left Arm)   Pulse 71   Temp 98.4 F (36.9 C) (Oral)   Resp 17   Ht 5\' 9"  (1.753 m)   Wt 109 kg   SpO2 100%   BMI 35.49 kg/m  Physical Exam Vitals and nursing note reviewed.  Constitutional:      General: He is not in acute distress.    Appearance: Normal appearance. He is well-developed. He is not ill-appearing or diaphoretic.  HENT:     Head: Normocephalic and atraumatic.  Eyes:     General:        Right eye: No discharge.        Left eye: No discharge.  Cardiovascular:     Rate and Rhythm: Normal rate and regular rhythm.  Pulmonary:     Effort: Pulmonary effort is normal. No respiratory distress.     Breath sounds: Normal breath sounds.  Musculoskeletal:     Comments: Mild tenderness to palpation over the right calf without palpable swelling, no palpable cord noted, PT and DP pulses 2+, normal sensation and strength.  No tenderness at the knee joint or in the thigh.  Neurological:     Mental Status: He is alert and  oriented to person, place, and time.     Coordination: Coordination normal.  Psychiatric:        Mood and Affect: Mood normal.        Behavior: Behavior normal.     ED Results / Procedures / Treatments   Labs (all labs ordered are listed, but only abnormal results are displayed) Labs Reviewed - No data to display  EKG None  Radiology No results found.  Procedures Procedures    Medications Ordered in ED Medications - No data to display  ED Course/ Medical Decision Making/ A&P                                 Medical Decision Making  Patient with prior history of PE and DVT not on anticoagulation presents with right calf pain, the lower extremity is neurovascularly intact on exam.  Ultrasound obtained and fortunately no evidence of DVT at this time.  Case management consulted to  help assist patient with obtaining PCP and financial support for prescriptions as his Eliquis was discontinued due to cost issues previously.  At this time there does not appear to be any evidence of an acute emergency medical condition requiring further emergent evaluation and the patient appears stable for discharge with appropriate outpatient follow up. Diagnosis and return precautions discussed with patient who verbalizes understanding and is agreeable to discharge.           Final Clinical Impression(s) / ED Diagnoses Final diagnoses:  Right calf pain    Rx / DC Orders ED Discharge Orders     None         Legrand Rams 08/12/23 0347    Lorre Nick, MD 08/16/23 [phone]

## 2023-08-12 NOTE — Progress Notes (Signed)
New Patient Office Visit  Subjective    [name], male    DOB: September 08, 1991  Age: 31 y.o. MRN: [id number]  CC:  Chief Complaint  Patient presents with   Establish Care    New ED follow up Was unable to get eliquis refilled-last dose taken 2wks ago     HPI Steve Branch presents to establish care and as a hospital follow up to his recent ED visits on 7/25 and 7/31.   Patient with a history of saddle PE, DVT, and migraines. Saddle PE s/p lysis in 2022, considered unprovoked. DVT involving the left popliteal vein, left posterior tibial veins, and left peroneal veins diagnosed 08/15/21. Patient was told to take eliquis 5 mg BID but patient was unable to afford it and did not take it consistently, eventually stopped and the prescription expired. Patient presented to ED on 07/22/23 due to right leg swelling and pain, [place] was negative for clot. Patient returned to ED when right leg swelling did not improve on 07/28/23 and given 1 dose of eliquis. Per patient, he was discharged with coupon for eliquis but no active prescription.   Patient not taking any prescribed medications, treats headaches as needed with OTC medications. He is currently unemployed, uninsured, and is facing stress regarding his finances. He is in the process of applying for the [employer]. Currently he lives with his brother. He is drinking up to 5 glasses of liquor a day, which he started doing more recently since being unemployed for the last few months. He is also smoking up to 1 pack of cigarettes a day. Denies use of other illicit drugs. Patient expressed interest in cutting back on smoking and/or quitting.   Family history includes PE and lung cancer in father, otherwise his mother and brother are healthy.    Outpatient Encounter Medications as of 08/11/2023  Medication Sig   Varenicline Tartrate, Starter, (CHANTIX STARTING MONTH PAK) 0.5 MG X 11 & 1 MG X 42 TBPK Take one 0.5mg  tablet once daily for 3  days, then take one 0.5mg  tablet twice daily for 4 days, then take one 1mg  tablet twice daily thereafter as directed on package   apixaban (ELIQUIS) 5 MG TABS tablet Take 1 tablet (5 mg total) by mouth 2 (two) times daily.   fluticasone (FLONASE) 50 MCG/ACT nasal spray Place 2 sprays into both nostrils daily.   [DISCONTINUED] apixaban (ELIQUIS) 5 MG TABS tablet Take 1 tablet (5 mg total) by mouth 2 (two) times daily.   No facility-administered encounter medications on file as of 08/11/2023.    Past Medical History:  Diagnosis Date   DVT (deep venous thrombosis) (HCC)    Migraine    Pulmonary embolism (HCC)     Past Surgical History:  Procedure Laterality Date   IR ANGIOGRAM FOLLOW UP STUDY  08/14/2021   IR ANGIOGRAM FOLLOW UP STUDY  08/14/2021   IR ANGIOGRAM PULMONARY BILATERAL SELECTIVE  08/14/2021   IR ANGIOGRAM SELECTIVE EACH ADDITIONAL VESSEL  08/14/2021   IR ANGIOGRAM SELECTIVE EACH ADDITIONAL VESSEL  08/14/2021   IR INFUSION THROMBOL ARTERIAL INITIAL (MS)  08/14/2021   IR INFUSION THROMBOL ARTERIAL INITIAL (MS)  08/14/2021   IR THROMB F/U EVAL ART/VEN FINAL DAY (MS)  08/14/2021   IR THROMB F/U EVAL ART/VEN FINAL DAY (MS)  08/15/2021    Family History  Problem Relation Age of Onset   Pulmonary embolism Father    Lung cancer Father     Social History   Socioeconomic  History   Marital status: Single    Spouse name: Not on file   Number of children: Not on file   Years of education: Not on file   Highest education level: Not on file  Occupational History   Not on file  Tobacco Use   Smoking status: Every Day    Current packs/day: 1.00    Types: Cigarettes   Smokeless tobacco: Never  Vaping Use   Vaping status: Never Used  Substance and Sexual Activity   Alcohol use: Yes    Comment: ocaasional.   Drug use: No   Sexual activity: Not on file  Other Topics Concern   Not on file  Social History Narrative   Not on file   Social Determinants of Health   Financial  Resource Strain: Not on file  Food Insecurity: No Food Insecurity (08/11/2023)   Hunger Vital Sign    Worried About Running Out of Food in the Last Year: Never true    Ran Out of Food in the Last Year: Never true  Transportation Needs: No Transportation Needs (08/11/2023)   PRAPARE - Administrator, Civil Service (Medical): No    Lack of Transportation (Non-Medical): No  Physical Activity: Not on file  Stress: Not on file  Social Connections: Not on file  Intimate Partner Violence: Not At Risk (08/11/2023)   Humiliation, Afraid, Rape, and Kick questionnaire    Fear of Current or Ex-Partner: No    Emotionally Abused: No    Physically Abused: No    Sexually Abused: No    Review of Systems  Constitutional:  Negative for chills and fever.  HENT:  Negative for hearing loss.   Eyes:  Negative for blurred vision and double vision.  Respiratory:  Positive for cough.        Feels winded at times, especially with exertion.    Cardiovascular:  Negative for chest pain and palpitations.  Gastrointestinal:  Negative for abdominal pain, nausea and vomiting.  Genitourinary:  Negative for dysuria.  Skin:  Negative for rash.  Neurological:  Negative for dizziness and headaches.  Psychiatric/Behavioral:  Negative for depression.        Stressed.       Objective    BP 132/74 (BP Location: Right Arm, Patient Position: Sitting, Cuff Size: Normal)   Pulse 63   Temp 98.2 F (36.8 C) (Oral)   Ht 5\' 9"  (1.753 m)   Wt 246 lb 3.2 oz (111.7 kg)   SpO2 98%   BMI 36.36 kg/m   Physical Exam HENT:     Head: Normocephalic and atraumatic.     Nose: Nose normal.     Mouth/Throat:     Mouth: Mucous membranes are moist.  Eyes:     Extraocular Movements: Extraocular movements intact.     Pupils: Pupils are equal, round, and reactive to light.  Cardiovascular:     Rate and Rhythm: Normal rate and regular rhythm.     Pulses: Normal pulses.     Heart sounds: Normal heart sounds.   Pulmonary:     Effort: Pulmonary effort is normal.     Breath sounds: Wheezing present.  Abdominal:     General: Bowel sounds are normal. There is no distension.     Palpations: Abdomen is soft.     Tenderness: There is no abdominal tenderness.  Musculoskeletal:        General: No swelling. Normal range of motion.  Neurological:     General: No focal  deficit present.     Mental Status: He is alert and oriented to person, place, and time.  Psychiatric:        Mood and Affect: Mood normal.        Behavior: Behavior normal.    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 Per chart review, saddle PE was considered unprovoked. Family history includes father with history of PE. Patient has never been diagnosed with a blood/blotting disorder. 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 Per patient, he has been prescribed eliquis 5 mg BID in the past but it was too expensive to take consistently. He was given one dose 5 mg dose of eliquis before ED discharge on 7/31 and coupons for eliquis but he states prescription was never sent to the pharmacy and previous prescription was expired. Patient presents today without signs or symptoms concerning for DVT. Patient  currently unemployed and uninsured; received application for [employer] today. Will need to monitor DVT/clotting recurrence to help determine if anticoagulation will be for 3-6 months or to be taken indefinitely.  Plan:  - Sent eliquis 5 mg BID prescription to [facility] community pharmacy under IM program  - Follow up at appointment in 4 weeks      Tobacco use    Patient smokes about a pack of cigarettes a day. Reports many years of this. Has not tried to quit in the past but reports he had no problems going without a cigarette while he was in the hospital. Previously participated in smoking cessation study where he quit "cold [place]", but started again shortly after study ended. Today he expressed interest in trying to quit, given studies that show cigarette smoking is associated with increased risk of blood clots.  Plan:  - Patient to decide on quit date, prescribed Chantix starter pack; follow up at next visit in 4 weeks      Alcohol use    Patient reports increased use of alcohol as he has been stressed about finances and unemployed status. He reports up to 5 drinks of liquor a day, with his last drink on 8/13. Discussed importance of cutting down on alcohol. Patient reports no difficulty cutting down on his own, but will follow up with patient in 4 weeks in case he is interested in medication therapy to help assist quitting.        Return in about 4 weeks (around 09/08/2023) for Eliquis and Chantix follow up .   Patient seen with Dr. Mikey Bussing.   Trulee Hamstra Colbert Coyer, MD

## 2023-08-12 NOTE — Assessment & Plan Note (Signed)
Patient reports increased use of alcohol as he has been stressed about finances and unemployed status. He reports up to 5 drinks of liquor a day, with his last drink on 8/13. Discussed importance of cutting down on alcohol. Patient reports no difficulty cutting down on his own, but will follow up with patient in 4 weeks in case he is interested in medication therapy to help assist quitting.

## 2023-08-13 ENCOUNTER — Other Ambulatory Visit: Payer: Self-pay

## 2023-08-13 ENCOUNTER — Other Ambulatory Visit (HOSPITAL_COMMUNITY): Payer: Self-pay

## 2023-08-13 NOTE — Progress Notes (Signed)
Internal Medicine Clinic Attending  I was physically present during the key portions of the resident provided service and participated in the medical decision making of patient's management care. I reviewed pertinent patient test results.  The assessment, diagnosis, and plan were formulated together and I agree with the documentation in the resident's note.  Gust Rung, DO
